# Patient Record
Sex: Male | Born: 1995 | Hispanic: Yes | Marital: Single | Smoking: Never smoker
Health system: Southern US, Community
[De-identification: ages and names within clinical notes are randomized; demographics above are authoritative.]

---
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
## 2015-05-23 ENCOUNTER — Encounter (HOSPITAL_COMMUNITY): Payer: Self-pay | Admitting: Emergency Medicine

## 2015-05-23 ENCOUNTER — Emergency Department (HOSPITAL_COMMUNITY)
Admission: EM | Admit: 2015-05-23 | Discharge: 2015-05-23 | Disposition: A | Discharge status: Home / Self Care | Payer: Self-pay | Attending: Emergency Medicine | Admitting: Emergency Medicine

## 2015-05-23 ENCOUNTER — Emergency Department (HOSPITAL_COMMUNITY): Payer: Self-pay

## 2015-05-23 DIAGNOSIS — S60222A Contusion of left hand, initial encounter: Secondary | ICD-10-CM | POA: Insufficient documentation

## 2015-05-23 DIAGNOSIS — S3992XA Unspecified injury of lower back, initial encounter: Secondary | ICD-10-CM | POA: Insufficient documentation

## 2015-05-23 DIAGNOSIS — S30811A Abrasion of abdominal wall, initial encounter: Secondary | ICD-10-CM | POA: Insufficient documentation

## 2015-05-23 DIAGNOSIS — Y92410 Unspecified street and highway as the place of occurrence of the external cause: Secondary | ICD-10-CM | POA: Insufficient documentation

## 2015-05-23 DIAGNOSIS — S81811A Laceration without foreign body, right lower leg, initial encounter: Secondary | ICD-10-CM | POA: Insufficient documentation

## 2015-05-23 DIAGNOSIS — Y9389 Activity, other specified: Secondary | ICD-10-CM | POA: Insufficient documentation

## 2015-05-23 DIAGNOSIS — S299XXA Unspecified injury of thorax, initial encounter: Secondary | ICD-10-CM | POA: Insufficient documentation

## 2015-05-23 DIAGNOSIS — T148XXA Other injury of unspecified body region, initial encounter: Secondary | ICD-10-CM

## 2015-05-23 DIAGNOSIS — Y999 Unspecified external cause status: Secondary | ICD-10-CM | POA: Insufficient documentation

## 2015-05-23 MED ORDER — NAPROXEN 500 MG PO TABS: 500.0000 mg | ORAL_TABLET | Freq: Two times a day (BID) | ORAL | Status: DC

## 2015-05-23 MED ORDER — METHOCARBAMOL 500 MG PO TABS: 1000.0000 mg | ORAL_TABLET | Freq: Four times a day (QID) | ORAL | Status: DC

## 2015-05-23 MED ORDER — KETOROLAC TROMETHAMINE 30 MG/ML IJ SOLN
30.0000 mg | Freq: Once | INTRAMUSCULAR | Status: AC
  Administered 2015-05-23: 30 mg via INTRAVENOUS
  Filled 2015-05-23: qty 1
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
## 2015-11-16 ENCOUNTER — Encounter: Payer: Self-pay | Admitting: Emergency Medicine

## 2015-11-16 ENCOUNTER — Emergency Department: Payer: Commercial Managed Care - PPO

## 2015-11-16 ENCOUNTER — Emergency Department
Admission: EM | Admit: 2015-11-16 | Discharge: 2015-11-16 | Disposition: A | Discharge status: Home / Self Care | Payer: Commercial Managed Care - PPO | Attending: Emergency Medicine | Admitting: Emergency Medicine

## 2015-11-16 DIAGNOSIS — X58XXXA Exposure to other specified factors, initial encounter: Secondary | ICD-10-CM | POA: Diagnosis not present

## 2015-11-16 DIAGNOSIS — S60552A Superficial foreign body of left hand, initial encounter: Secondary | ICD-10-CM

## 2015-11-16 DIAGNOSIS — Y9389 Activity, other specified: Secondary | ICD-10-CM | POA: Diagnosis not present

## 2015-11-16 DIAGNOSIS — Z79899 Other long term (current) drug therapy: Secondary | ICD-10-CM | POA: Insufficient documentation

## 2015-11-16 DIAGNOSIS — Y9289 Other specified places as the place of occurrence of the external cause: Secondary | ICD-10-CM | POA: Diagnosis not present

## 2015-11-16 DIAGNOSIS — Z791 Long term (current) use of non-steroidal anti-inflammatories (NSAID): Secondary | ICD-10-CM | POA: Diagnosis not present

## 2015-11-16 DIAGNOSIS — Y998 Other external cause status: Secondary | ICD-10-CM | POA: Diagnosis not present

## 2015-11-16 DIAGNOSIS — R52 Pain, unspecified: Secondary | ICD-10-CM

## 2015-11-16 NOTE — ED Notes (Signed)
Pt unable to sign e-signature due to an open chart by another ED staff
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
## 2015-11-16 NOTE — Discharge Instructions (Signed)
Please continue to keep wound clean and dry. Apply Neosporin daily. Follow-up with Dr. Hyacinth MeekerMiller in 5-7 days if any pain or discomfort. Return to the ER for any increased pain swelling warmth redness or drainage.

## 2016-08-20 ENCOUNTER — Emergency Department
Admission: EM | Admit: 2016-08-20 | Discharge: 2016-08-20 | Disposition: A | Discharge status: Home / Self Care | Payer: Commercial Managed Care - PPO | Attending: Student | Admitting: Student

## 2016-08-20 ENCOUNTER — Encounter: Payer: Self-pay | Admitting: Emergency Medicine

## 2016-08-20 ENCOUNTER — Emergency Department: Payer: Commercial Managed Care - PPO

## 2016-08-20 DIAGNOSIS — R0789 Other chest pain: Secondary | ICD-10-CM | POA: Diagnosis not present

## 2016-08-20 DIAGNOSIS — R079 Chest pain, unspecified: Secondary | ICD-10-CM

## 2016-08-20 LAB — CBC WITH DIFFERENTIAL/PLATELET
BASOS ABS: 0 10*3/uL (ref 0–0.1)
BASOS PCT: 1 %
EOS ABS: 0.2 10*3/uL (ref 0–0.7)
EOS PCT: 3 %
HCT: 46.1 % (ref 40.0–52.0)
Hemoglobin: 16.3 g/dL (ref 13.0–18.0)
LYMPHS PCT: 42 %
Lymphs Abs: 3.1 10*3/uL (ref 1.0–3.6)
MCH: 30.4 pg (ref 26.0–34.0)
MCHC: 35.3 g/dL (ref 32.0–36.0)
MCV: 86.3 fL (ref 80.0–100.0)
MONO ABS: 0.7 10*3/uL (ref 0.2–1.0)
Monocytes Relative: 10 %
Neutro Abs: 3.3 10*3/uL (ref 1.4–6.5)
Neutrophils Relative %: 44 %
PLATELETS: 185 10*3/uL (ref 150–440)
RBC: 5.34 MIL/uL (ref 4.40–5.90)
RDW: 12.6 % (ref 11.5–14.5)
WBC: 7.3 10*3/uL (ref 3.8–10.6)

## 2016-08-20 LAB — BASIC METABOLIC PANEL
ANION GAP: 7 (ref 5–15)
BUN: 12 mg/dL (ref 6–20)
CALCIUM: 9 mg/dL (ref 8.9–10.3)
CO2: 26 mmol/L (ref 22–32)
Chloride: 104 mmol/L (ref 101–111)
Creatinine, Ser: 0.94 mg/dL (ref 0.61–1.24)
Glucose, Bld: 98 mg/dL (ref 65–99)
POTASSIUM: 3.5 mmol/L (ref 3.5–5.1)
SODIUM: 137 mmol/L (ref 135–145)

## 2016-08-20 LAB — TROPONIN I: Troponin I: <0.03 ng/mL (ref <0.03)

## 2016-08-20 MED ORDER — IBUPROFEN 400 MG PO TABS
600.0000 mg | ORAL_TABLET | Freq: Once | ORAL | Status: AC
  Administered 2016-08-20: 600 mg via ORAL
  Filled 2016-08-20: qty 2

## 2016-08-20 NOTE — ED Triage Notes (Signed)
Pt presents to ED with reports of left sided chest pain with radiation to neck. Pt in NAD in triage.
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
## 2016-10-13 ENCOUNTER — Encounter: Payer: Self-pay | Admitting: Emergency Medicine

## 2016-10-13 ENCOUNTER — Emergency Department
Admission: EM | Admit: 2016-10-13 | Discharge: 2016-10-13 | Disposition: A | Discharge status: Home / Self Care | Payer: Commercial Managed Care - PPO | Attending: Emergency Medicine | Admitting: Emergency Medicine

## 2016-10-13 DIAGNOSIS — R059 Cough, unspecified: Secondary | ICD-10-CM

## 2016-10-13 DIAGNOSIS — Z79899 Other long term (current) drug therapy: Secondary | ICD-10-CM | POA: Diagnosis not present

## 2016-10-13 DIAGNOSIS — R0781 Pleurodynia: Secondary | ICD-10-CM | POA: Diagnosis not present

## 2016-10-13 DIAGNOSIS — R05 Cough: Secondary | ICD-10-CM | POA: Diagnosis not present

## 2016-10-13 MED ORDER — IBUPROFEN 600 MG PO TABS: 600.0000 mg | ORAL_TABLET | Freq: Three times a day (TID) | ORAL | 0 refills | Status: DC | PRN

## 2016-10-13 MED ORDER — BENZONATATE 100 MG PO CAPS: 200.0000 mg | ORAL_CAPSULE | Freq: Three times a day (TID) | ORAL | 0 refills | Status: DC | PRN

## 2016-10-13 NOTE — ED Triage Notes (Signed)
Pt reports left side pain that began today. Pt reports has had cough and cold symptoms for approximately one week. Pt denies fever, denies any changes in appetite. Pt ambulatory to triage. No apparent distress noted.

## 2016-10-13 NOTE — ED Notes (Signed)
Patient presents to the ED with severe rib pain and shortness of breath that occurred around 7am.  Patient states pain lasted about 1 min.  Now patient only reports tenderness to the area.  Patient is in no obvious distress at this time.  Denies difficulty breathing at this time.

## 2016-10-13 NOTE — ED Provider Notes (Signed)
[facility] Emergency Department Provider Note  ____________________________________________   First MD Initiated Contact with Patient 10/13/16 [phone]     (approximate)  I have reviewed the triage vital signs and the nursing notes.   HISTORY  Chief Complaint Left side pain and Cough   HPI Fernando Wolfe is a 20 y.o. male is here with complaint of left lateral side pain after coughing. Patient states he has not taken any over-the-counter medication for cough. He states that he has had some cold symptoms and occasionally will cough. His cough has been nonproductive. He denies any fever, chills, nausea or vomiting. He is also not taking any over-the-counter medication for his cold symptoms. He denies smoking. He has no history of respiratory problems. Pain is increased with movement. Patient states "I think I pulled a muscle". He denies any breathing difficulty. He is taken some Tylenol infrequently without any improvement. He rates his pain as 7/10.   History reviewed. No pertinent past medical history.  There are no active problems to display for this patient.   No past surgical history on file.  Prior to Admission medications   Medication Sig Start Date End Date Taking? Authorizing Provider  benzonatate (TESSALON PERLES) 100 MG capsule Take 2 capsules (200 mg total) by mouth 3 (three) times daily as needed for cough. 10/13/16 10/13/17  Tommi Rumpshonda L Abiageal Blowe, PA-C  ibuprofen (ADVIL,MOTRIN) 600 MG tablet Take 1 tablet (600 mg total) by mouth every 8 (eight) hours as needed. 10/13/16   Tommi Rumpshonda L Bahja Bence, PA-C  methocarbamol (ROBAXIN) 500 MG tablet Take 2 tablets (1,000 mg total) by mouth 4 (four) times daily. 05/23/15   Renne CriglerJoshua Geiple, PA-C  naproxen (NAPROSYN) 500 MG tablet Take 1 tablet (500 mg total) by mouth 2 (two) times daily. 05/23/15   Renne CriglerJoshua Geiple, PA-C    Allergies Patient has no known allergies.  No family history on file.  Social History Social  History  Substance Use Topics  . Smoking status: Never Smoker  . Smokeless tobacco: Never Used  . Alcohol use Yes     Comment: "not much"    Review of Systems Constitutional: No fever/chills Eyes: No visual changes. Cardiovascular: Denies chest pain. Respiratory: Denies shortness of breath. Positive nonproductive cough. Gastrointestinal:  No nausea, no vomiting. Musculoskeletal: Negative for back pain. Positive left rib pain. Skin: Negative for rash. Neurological: Negative for headaches, focal weakness or numbness.  10-point ROS otherwise negative.  ____________________________________________   PHYSICAL EXAM:  VITAL SIGNS: ED Triage Vitals [10/13/16 0855]  Enc Vitals Group     BP 117/79     Pulse Rate 73     Resp 18     Temp 98.2 F (36.8 C)     Temp Source Oral     SpO2 100 %     Weight 205 lb (93 kg)     Height 5\' 11"  (1.803 m)     Head Circumference      Peak Flow      Pain Score 7     Pain Loc      Pain Edu?      Excl. in GC?     Constitutional: Alert and oriented. Well appearing and in no acute distress. Eyes: Conjunctivae are normal. PERRL. EOMI. Head: Atraumatic. Nose: Minimal congestion/no rhinnorhea.   EACs and TMs are clear bilaterally. Mouth/Throat: Mucous membranes are moist.  Oropharynx non-erythematous. Neck: No stridor.   Hematological/Lymphatic/Immunilogical: No cervical lymphadenopathy. Cardiovascular: Normal rate, regular rhythm. Grossly normal heart sounds.  Good  peripheral circulation. Respiratory: Normal respiratory effort.  No retractions. Lungs CTAB. Gastrointestinal: Soft and nontender. No distention.  Musculoskeletal: Moves upper and lower extremities without any difficulty. Normal gait was noted. There is some minimal tenderness on palpation of the left lateral ribs approximately 7,8 and 9. No deformity was noted. No soft tissue swelling in this area. No ecchymosis or abrasions seen. Neurologic:  Normal speech and language. No gross  focal neurologic deficits are appreciated. No gait instability. Skin:  Skin is warm, dry and intact. No rash noted. Psychiatric: Mood and affect are normal. Speech and behavior are normal.  ____________________________________________   LABS (all labs ordered are listed, but only abnormal results are displayed)  Labs Reviewed - No data to display  PROCEDURES  Procedure(s) performed: None  Procedures  Critical Care performed: No  ____________________________________________   INITIAL IMPRESSION / ASSESSMENT AND PLAN / ED COURSE  Pertinent labs & imaging results that were available during my care of the patient were reviewed by me and considered in my medical decision making (see chart for details).    Clinical Course    Patient agrees with assessment that most likely his pulled some muscles while coughing. Patient [facility] as needed for cough every 8 hours and ibuprofen to be taken every 8 hours with food. He will follow up with his primary care Dr. Phineas Realharles Drew if any continued problems.  ____________________________________________   FINAL CLINICAL IMPRESSION(S) / ED DIAGNOSES  Final diagnoses:  Rib pain on left side  Cough      NEW MEDICATIONS STARTED DURING THIS VISIT:  Discharge Medication List as of 10/13/2016  9:51 AM    START taking these medications   Details  benzonatate (TESSALON PERLES) 100 MG capsule Take 2 capsules (200 mg total) by mouth 3 (three) times daily as needed for cough., Starting Fri 10/13/2016, Until Sat 10/13/2017, Print    ibuprofen (ADVIL,MOTRIN) 600 MG tablet Take 1 tablet (600 mg total) by mouth every 8 (eight) hours as needed., Starting Fri 10/13/2016, Print         Note:  This document was prepared using Dragon voice recognition software and may include unintentional dictation errors.    Tommi Rumpshonda L Kenzi Bardwell, PA-C 10/13/16 1000    Nita Sicklearolina Veronese, MD 10/15/16 [phone]

## 2016-10-13 NOTE — Discharge Instructions (Signed)
Been taking Tessalon every 8 hours as needed for cough. This medication will not cause drowsiness. Ibuprofen every 8 hours with food.

## 2017-04-25 ENCOUNTER — Ambulatory Visit
Admission: EM | Admit: 2017-04-25 | Discharge: 2017-04-25 | Disposition: A | Discharge status: Home / Self Care | Payer: Commercial Managed Care - PPO | Attending: Family Medicine | Admitting: Family Medicine

## 2017-04-25 ENCOUNTER — Encounter: Payer: Self-pay | Admitting: *Deleted

## 2017-04-25 DIAGNOSIS — Z113 Encounter for screening for infections with a predominantly sexual mode of transmission: Secondary | ICD-10-CM

## 2017-04-25 DIAGNOSIS — N4889 Other specified disorders of penis: Secondary | ICD-10-CM

## 2017-04-25 DIAGNOSIS — R300 Dysuria: Secondary | ICD-10-CM

## 2017-04-25 LAB — URINALYSIS, COMPLETE (UACMP) WITH MICROSCOPIC
Bacteria, UA: NONE SEEN
Bilirubin Urine: NEGATIVE
GLUCOSE, UA: NEGATIVE mg/dL
HGB URINE DIPSTICK: NEGATIVE
Ketones, ur: NEGATIVE mg/dL
Leukocytes, UA: NEGATIVE
NITRITE: NEGATIVE
PROTEIN: NEGATIVE mg/dL
RBC / HPF: NONE SEEN RBC/hpf (ref 0–5)
SQUAMOUS EPITHELIAL / LPF: NONE SEEN
Specific Gravity, Urine: 1.015 (ref 1.005–1.030)
pH: 6 (ref 5.0–8.0)

## 2017-04-25 LAB — CHLAMYDIA/NGC RT PCR (ARMC ONLY)
Chlamydia Tr: NOT DETECTED
N gonorrhoeae: NOT DETECTED

## 2017-04-25 MED ORDER — AZITHROMYCIN 500 MG PO TABS
1000.0000 mg | ORAL_TABLET | Freq: Every day | ORAL | Status: DC
  Administered 2017-04-25: 1000 mg via ORAL

## 2017-04-25 MED ORDER — CEFTRIAXONE SODIUM 250 MG IJ SOLR
250.0000 mg | Freq: Once | INTRAMUSCULAR | Status: AC
  Administered 2017-04-25: 250 mg via INTRAMUSCULAR

## 2017-04-25 NOTE — ED Provider Notes (Signed)
[facility] ____________________________________________  Time seen: Approximately 5:28 PM  I have reviewed the triage vital signs and the nursing notes.   HISTORY  Chief Complaint Penis Pain   HPI Fernando Wolfe is a 21 y.o. male presenting for evaluation of burning with urination that has been present for about 2 weeks. Patient states approximate one week prior to the onset of dysuria he had unprotected sex with a male partner described as a one night stand that he did not know. Reports no condoms used. Reports prior to that sexual encounter, last sexual encounter approximately 3 months prior without any symptoms afterwards. Patient reports he has some burning with urination only. Denies irritation or burning at rest. Denies any urinary frequency, or urinary urgency or abdominal pain or back pain. Denies fevers. Denies any penile discharge or drainage. Reports that he does have a bump on the foreskin of his penis that is in present and unchanged for at least 6 months to one year. Has never had that evaluated. Denies any history of cold sores or herpes. Denies previous STDs. Patient reports otherwise he feels well. Denies any aggravating or alleviating factors. No medications taken at home. Denies history of similar. Denies previous urinary tract infection. States not diabetic. No history of HIV. Denies other complaints.  Denies chest pain, shortness of breath, abdominal pain, extremity pain, extremity swelling or rash. Denies recent sickness. Denies recent antibiotic use.    History reviewed. No pertinent past medical history. Denies There are no active problems to display for this patient.   History reviewed. No pertinent surgical history.   No current facility-administered medications for this encounter.   Current Outpatient Prescriptions:  None  Allergies Patient has no known allergies.  History reviewed. No pertinent family history.  Social  History Social History  Substance Use Topics  . Smoking status: Never Smoker  . Smokeless tobacco: Never Used  . Alcohol use Yes     Comment: "not much"    Review of Systems Constitutional: No fever/chills Eyes: No visual changes. ENT: No sore throat. Cardiovascular: Denies chest pain. Respiratory: Denies shortness of breath. Gastrointestinal: No abdominal pain.  No nausea, no vomiting.  No diarrhea.  No constipation. Genitourinary: Positive for dysuria. Musculoskeletal: Negative for back pain. Skin: Negative for rash.   ____________________________________________   PHYSICAL EXAM:  VITAL SIGNS: ED Triage Vitals  Enc Vitals Group     BP 04/25/17 1709 (!) 145/86     Pulse Rate 04/25/17 1709 94     Resp 04/25/17 1709 16     Temp 04/25/17 1709 98.5 F (36.9 C)     Temp Source 04/25/17 1709 Oral     SpO2 04/25/17 1709 100 %     Weight 04/25/17 1710 210 lb (95.3 kg)     Height 04/25/17 1710 5\' 11"  (1.803 m)     Head Circumference --      Peak Flow --      Pain Score 04/25/17 1710 0     Pain Loc --      Pain Edu? --      Excl. in GC? --     Constitutional: Alert and oriented. Well appearing and in no acute distress. ENT      Mouth/Throat: Mucous membranes are moist.Oropharynx non-erythematous. Cardiovascular: Normal rate, regular rhythm. Grossly normal heart sounds.  Good peripheral circulation. Respiratory: Normal respiratory effort without tachypnea nor retractions. Breath sounds are clear and equal bilaterally. No wheezes, rales, rhonchi. Gastrointestinal: Soft and nontender. No CVA tenderness. Male:  Exam completed with Jeannett Senior RN at bedside as chaperone. No mass or bulge. No penile or testicular tenderness. No discharge. No erythema. Uncircumcised with 1 small <0.5cm circular skin colored dome lesion present to the dorsal aspect of penis without erythema, tenderness or drainage. No other lesion noted. No vesicular or ulcerative appearing lesion. Musculoskeletal:   No midline cervical, thoracic or lumbar tenderness to palpation.  Neurologic:  Normal speech and language. Speech is normal. No gait instability.  Skin:  Skin is warm, dry. Psychiatric: Mood and affect are normal. Speech and behavior are normal. Patient exhibits appropriate insight and judgment   ___________________________________________   LABS (all labs ordered are listed, but only abnormal results are displayed)  Labs Reviewed  URINALYSIS, COMPLETE (UACMP) WITH MICROSCOPIC - Abnormal; Notable for the following:       Result Value   Color, Urine STRAW (*)    All other components within normal limits  CHLAMYDIA/NGC RT PCR ([facility] ONLY)  HIV ANTIBODY (ROUTINE TESTING)  RPR  HSV(HERPES SIMPLEX VRS) I + II AB-IGG  HSV(HERPES SIMPLEX VRS) I + II AB-IGM    PROCEDURES Procedures     INITIAL IMPRESSION / ASSESSMENT AND PLAN / ED COURSE  Pertinent labs & imaging results that were available during my care of the patient were reviewed by me and considered in my medical decision making (see chart for details).  Very well-appearing patient. No acute distress. Urinalysis reviewed. Discussed in detail with patient regarding testing of STDs. Will test for gonorrhea, chlamydia, syphilis, herpes, HIV. Since the patient will empirically treat for gonorrhea and chlamydia while in urgent care with 1 g oral azithromycin and 250 mg IM Rocephin. Discussed with patient to follow-up with primary care or [facility] regarding penile lesion and concern for HPV/genital wart. Discussed in detail with patient no sexual activity until all symptom resolution and follow-up. Discussed practicing safe sex. Encouraged supportive care.Discussed indication, risks and benefits of medications with patient.  Discussed follow up with Primary care physician this week. Discussed follow up and return parameters including no resolution or any worsening concerns. Patient verbalized understanding and  agreed to plan.   ____________________________________________   FINAL CLINICAL IMPRESSION(S) / ED DIAGNOSES  Final diagnoses:  Dysuria  Screen for STD (sexually transmitted disease)     Discharge Medication List as of 04/25/2017  5:48 PM      Note: This dictation was prepared with Dragon dictation along with smaller phrase technology. Any transcriptional errors that result from this process are unintentional.         Renford Dills, NP 04/25/17 1812

## 2017-04-25 NOTE — Discharge Instructions (Signed)
Practice safe sex. No sexual activity until symptoms resolve and follow up as discussed.   Follow up with your primary care physician this week or the above as discussed. Return to Urgent care for new or worsening concerns.

## 2017-04-25 NOTE — ED Triage Notes (Signed)
Patient started having symptom of burning on urination 2 weeks ago. Patient is not sure if he has a STD or UTI.

## 2017-04-27 ENCOUNTER — Encounter: Payer: Self-pay | Admitting: Emergency Medicine

## 2017-04-27 ENCOUNTER — Emergency Department
Admission: EM | Admit: 2017-04-27 | Discharge: 2017-04-27 | Disposition: A | Discharge status: Home / Self Care | Payer: Self-pay | Attending: Emergency Medicine | Admitting: Emergency Medicine

## 2017-04-27 ENCOUNTER — Emergency Department: Payer: Self-pay

## 2017-04-27 DIAGNOSIS — N390 Urinary tract infection, site not specified: Secondary | ICD-10-CM | POA: Insufficient documentation

## 2017-04-27 DIAGNOSIS — Z79899 Other long term (current) drug therapy: Secondary | ICD-10-CM | POA: Insufficient documentation

## 2017-04-27 DIAGNOSIS — R109 Unspecified abdominal pain: Secondary | ICD-10-CM

## 2017-04-27 LAB — URINALYSIS, COMPLETE (UACMP) WITH MICROSCOPIC
Bacteria, UA: NONE SEEN
Bilirubin Urine: NEGATIVE
GLUCOSE, UA: NEGATIVE mg/dL
HGB URINE DIPSTICK: NEGATIVE
Ketones, ur: NEGATIVE mg/dL
Nitrite: NEGATIVE
PH: 6 (ref 5.0–8.0)
Protein, ur: NEGATIVE mg/dL
SPECIFIC GRAVITY, URINE: 1.019 (ref 1.005–1.030)
SQUAMOUS EPITHELIAL / LPF: NONE SEEN

## 2017-04-27 LAB — HIV ANTIBODY (ROUTINE TESTING W REFLEX): HIV Screen 4th Generation wRfx: NONREACTIVE

## 2017-04-27 LAB — CBC
HCT: 49.7 % (ref 40.0–52.0)
Hemoglobin: 17.4 g/dL (ref 13.0–18.0)
MCH: 29.8 pg (ref 26.0–34.0)
MCHC: 35 g/dL (ref 32.0–36.0)
MCV: 85.2 fL (ref 80.0–100.0)
PLATELETS: 232 10*3/uL (ref 150–440)
RBC: 5.84 MIL/uL (ref 4.40–5.90)
RDW: 12.9 % (ref 11.5–14.5)
WBC: 6.6 10*3/uL (ref 3.8–10.6)

## 2017-04-27 LAB — COMPREHENSIVE METABOLIC PANEL
ALK PHOS: 112 U/L (ref 38–126)
ALT: 35 U/L (ref 17–63)
AST: 34 U/L (ref 15–41)
Albumin: 5 g/dL (ref 3.5–5.0)
Anion gap: 10 (ref 5–15)
BUN: 12 mg/dL (ref 6–20)
CALCIUM: 10.3 mg/dL (ref 8.9–10.3)
CO2: 25 mmol/L (ref 22–32)
CREATININE: 0.97 mg/dL (ref 0.61–1.24)
Chloride: 104 mmol/L (ref 101–111)
GFR calc non Af Amer: >60 mL/min (ref >60)
GLUCOSE: 99 mg/dL (ref 65–99)
Potassium: 3.7 mmol/L (ref 3.5–5.1)
Sodium: 139 mmol/L (ref 135–145)
Total Bilirubin: 0.9 mg/dL (ref 0.3–1.2)
Total Protein: 8.8 g/dL — ABNORMAL HIGH (ref 6.5–8.1)

## 2017-04-27 LAB — LIPASE, BLOOD: Lipase: 28 U/L (ref 11–51)

## 2017-04-27 LAB — HSV(HERPES SIMPLEX VRS) I + II AB-IGG
HSV 1 GLYCOPROTEIN G AB, IGG: 5.99 {index} — AB (ref 0.00–0.90)
HSV 2 Glycoprotein G Ab, IgG: <0.91 index (ref 0.00–0.90)

## 2017-04-27 LAB — HSV(HERPES SIMPLEX VRS) I + II AB-IGM: HSVI/II Comb IgM: <0.91 Ratio (ref 0.00–0.90)

## 2017-04-27 LAB — RPR: RPR Ser Ql: NONREACTIVE

## 2017-04-27 MED ORDER — CEPHALEXIN 500 MG PO CAPS: 500.0000 mg | ORAL_CAPSULE | Freq: Two times a day (BID) | ORAL | 0 refills | Status: DC

## 2017-04-27 MED ORDER — CEPHALEXIN 500 MG PO CAPS
500.0000 mg | ORAL_CAPSULE | Freq: Once | ORAL | Status: AC
  Administered 2017-04-27: 500 mg via ORAL
  Filled 2017-04-27: qty 1

## 2017-04-27 NOTE — ED Notes (Signed)
MD at bedside with pt at this time

## 2017-04-27 NOTE — ED Triage Notes (Signed)
Pt reports left side flank pain that radiates to LLQ for three weeks. Pt reports nausea and diarrhea as well.

## 2017-04-27 NOTE — ED Provider Notes (Signed)
[facility] Emergency Department Provider Note   ____________________________________________    I have reviewed the triage vital signs and the nursing notes.   HISTORY  Chief Complaint Flank Pain and Abdominal Pain     HPI Fernando Wolfe is a 21 y.o. male who presents with complaints of dysuria and mild left-sided pain. Patient reports this is been going on for several days. He was seen in urgent care and treated with Rocephin and azithromycin for possible STD however his labs have returned negative for chlamydia or GC. Patient reports he is not circumcised. Denies a history of urinary tract infection but reports frequent urination and burning with urination. No hematuria. No history of kidney stones. No nausea or vomiting.   History reviewed. No pertinent past medical history.  There are no active problems to display for this patient.   History reviewed. No pertinent surgical history.  Prior to Admission medications   Medication Sig Start Date End Date Taking? Authorizing Provider  benzonatate (TESSALON PERLES) 100 MG capsule Take 2 capsules (200 mg total) by mouth 3 (three) times daily as needed for cough. 10/13/16 10/13/17  Tommi RumpsSummers, Rhonda L, PA-C  cephALEXin (KEFLEX) 500 MG capsule Take 1 capsule (500 mg total) by mouth 2 (two) times daily. 04/27/17   Jene EveryKinner, Hydeia Mcatee, MD  ibuprofen (ADVIL,MOTRIN) 600 MG tablet Take 1 tablet (600 mg total) by mouth every 8 (eight) hours as needed. 10/13/16   Tommi RumpsSummers, Rhonda L, PA-C  methocarbamol (ROBAXIN) 500 MG tablet Take 2 tablets (1,000 mg total) by mouth 4 (four) times daily. 05/23/15   Renne CriglerGeiple, Joshua, PA-C  naproxen (NAPROSYN) 500 MG tablet Take 1 tablet (500 mg total) by mouth 2 (two) times daily. 05/23/15   Renne CriglerGeiple, Joshua, PA-C     Allergies Patient has no known allergies.  No family history on file.  Social History Social History  Substance Use Topics  . Smoking status: Never Smoker  . Smokeless  tobacco: Never Used  . Alcohol use Yes     Comment: "not much"    Review of Systems  Constitutional: No fever/chills Eyes: No visual changes.  ENT: No sore throat. Cardiovascular: Denies chest pain. Respiratory: Denies shortness of breath. Gastrointestinal: No abdominal pain.  No nausea, no vomiting.   Genitourinary: As above Musculoskeletal: Negative for back pain. Skin: Negative for rash. Neurological: Negative for headaches or weakness   ____________________________________________   PHYSICAL EXAM:  VITAL SIGNS: ED Triage Vitals [04/27/17 1703]  Enc Vitals Group     BP (!) 138/107     Pulse Rate 93     Resp 18     Temp 99.1 F (37.3 C)     Temp Source Oral     SpO2 99 %     Weight 95.3 kg (210 lb)     Height 1.803 m (5\' 11" )     Head Circumference      Peak Flow      Pain Score 7     Pain Loc      Pain Edu?      Excl. in GC?     Constitutional: Alert and oriented. No acute distress. Pleasant and interactive Eyes: Conjunctivae are normal.   Nose: No congestion/rhinnorhea. Mouth/Throat: Mucous membranes are moist.    Cardiovascular: Normal rate, regular rhythm. Grossly normal heart sounds.  Good peripheral circulation. Respiratory: Normal respiratory effort.  No retractions. Lungs CTAB. Gastrointestinal: Soft and nontender. No distention.  No CVA tenderness. Genitourinary: deferred Musculoskeletal: No lower extremity tenderness nor  edema.  Warm and well perfused Neurologic:  Normal speech and language. No gross focal neurologic deficits are appreciated.  Skin:  Skin is warm, dry and intact. No rash noted. Psychiatric: Mood and affect are normal. Speech and behavior are normal.  ____________________________________________   LABS (all labs ordered are listed, but only abnormal results are displayed)  Labs Reviewed  COMPREHENSIVE METABOLIC PANEL - Abnormal; Notable for the following:       Result Value   Total Protein 8.8 (*)    All other components  within normal limits  URINALYSIS, COMPLETE (UACMP) WITH MICROSCOPIC - Abnormal; Notable for the following:    Color, Urine YELLOW (*)    APPearance CLEAR (*)    Leukocytes, UA LARGE (*)    All other components within normal limits  URINE CULTURE  LIPASE, BLOOD  CBC   ____________________________________________  EKG  None ____________________________________________  RADIOLOGY  CT renal stone study negative ____________________________________________   PROCEDURES  Procedure(s) performed: No    Critical Care performed:No ____________________________________________   INITIAL IMPRESSION / ASSESSMENT AND PLAN / ED COURSE  Pertinent labs & imaging results that were available during my care of the patient were reviewed by me and considered in my medical decision making (see chart for details).  Patient well-appearing and in no acute distress. Exam is reassuring. Positive leukocytes on urinalysis, I suspect UTI given his history of present illness. Urine culture sent, I will start him on Keflex. Return precautions discussed    ____________________________________________   FINAL CLINICAL IMPRESSION(S) / ED DIAGNOSES  Final diagnoses:  Acute left flank pain  Lower urinary tract infectious disease      NEW MEDICATIONS STARTED DURING THIS VISIT:  Discharge Medication List as of 04/27/2017  8:04 PM    START taking these medications   Details  cephALEXin (KEFLEX) 500 MG capsule Take 1 capsule (500 mg total) by mouth 2 (two) times daily., Starting Fri 04/27/2017, Print         Note:  This document was prepared using Dragon voice recognition software and may include unintentional dictation errors.    Jene Every, MD 04/27/17 2026

## 2017-04-29 LAB — URINE CULTURE: Culture: NO GROWTH

## 2017-05-04 ENCOUNTER — Telehealth: Payer: Self-pay | Admitting: Emergency Medicine

## 2017-05-04 NOTE — Telephone Encounter (Signed)
Pat had left message saying he lost his antibiotic pills.  I called him and explained result of urine culture is no growth.  I asked if he still would want the antibiotics.  He said no and that he will call his pcp-charles drew and mak appt regarding his symptoms.

## 2017-06-11 ENCOUNTER — Encounter: Payer: Self-pay | Admitting: Emergency Medicine

## 2017-06-11 ENCOUNTER — Emergency Department
Admission: EM | Admit: 2017-06-11 | Discharge: 2017-06-11 | Disposition: A | Discharge status: Home / Self Care | Payer: Self-pay | Attending: Emergency Medicine | Admitting: Emergency Medicine

## 2017-06-11 DIAGNOSIS — F419 Anxiety disorder, unspecified: Secondary | ICD-10-CM

## 2017-06-11 DIAGNOSIS — R0789 Other chest pain: Secondary | ICD-10-CM

## 2017-06-11 DIAGNOSIS — Z79899 Other long term (current) drug therapy: Secondary | ICD-10-CM | POA: Insufficient documentation

## 2017-06-11 NOTE — Discharge Instructions (Signed)
[facility]up [facility] see contact information attached. If symptoms worsen or do not hesitate to return to emergency department.

## 2017-06-11 NOTE — ED Provider Notes (Signed)
[facility] Emergency Department Provider Note   ____________________________________________   I have reviewed the triage vital signs and the nursing notes.   HISTORY  Chief Complaint Chest Pain    HPI Fernando Wolfe is a 21 y.o. male presents emergency department with left-sided chest pain and anxiety after having an argument with his girlfriend earlier today. Patient reports symptoms have improved since arriving in the emergency department and he feels symptoms are related to situational anxiety. Patient is currently being treated for H. pylori and has been compliant with antibiotic regimen. Patient denies fever, chills, headache, vision changes, shortness of breath, abdominal pain, nausea and vomiting.  History reviewed. No pertinent past medical history.  There are no active problems to display for this patient.   History reviewed. No pertinent surgical history.  Prior to Admission medications   Medication Sig Start Date End Date Taking? Authorizing Provider  benzonatate (TESSALON PERLES) 100 MG capsule Take 2 capsules (200 mg total) by mouth 3 (three) times daily as needed for cough. 10/13/16 10/13/17  Tommi RumpsSummers, Rhonda L, PA-C  cephALEXin (KEFLEX) 500 MG capsule Take 1 capsule (500 mg total) by mouth 2 (two) times daily. 04/27/17   Jene EveryKinner, Robert, MD  ibuprofen (ADVIL,MOTRIN) 600 MG tablet Take 1 tablet (600 mg total) by mouth every 8 (eight) hours as needed. 10/13/16   Tommi RumpsSummers, Rhonda L, PA-C  methocarbamol (ROBAXIN) 500 MG tablet Take 2 tablets (1,000 mg total) by mouth 4 (four) times daily. 05/23/15   Renne CriglerGeiple, Joshua, PA-C  naproxen (NAPROSYN) 500 MG tablet Take 1 tablet (500 mg total) by mouth 2 (two) times daily. 05/23/15   Renne CriglerGeiple, Joshua, PA-C    Allergies Patient has no known allergies.  No family history on file.  Social History Social History  Substance Use Topics  . Smoking status: Never Smoker  . Smokeless tobacco: Never Used  .  Alcohol use Yes     Comment: "not much"    Review of Systems Constitutional: Negative for fever/chills.  Eyes: No visual changes. ENT:  Negative for sore throat and for difficulty swallowing Cardiovascular: Denies chest pain. Respiratory: Denies cough. Denies shortness of breath. Gastrointestinal: No abdominal pain.  No nausea, vomiting, diarrhea. Genitourinary: Negative for dysuria. Musculoskeletal: Negative for back pain. Skin: Negative for rash. Neurological: Negative for headaches.  Negative focal weakness or numbness. Negative for loss of consciousness. Able to ambulate. ____________________________________________   PHYSICAL EXAM:  VITAL SIGNS: ED Triage Vitals  Enc Vitals Group     BP 06/11/17 1526 137/80     Pulse Rate 06/11/17 1526 89     Resp 06/11/17 1526 18     Temp 06/11/17 1526 98.8 F (37.1 C)     Temp Source 06/11/17 1526 Oral     SpO2 06/11/17 1526 98 %     Weight 06/11/17 1527 210 lb (95.3 kg)     Height 06/11/17 1527 5\' 11"  (1.803 m)     Head Circumference --      Peak Flow --      Pain Score 06/11/17 1536 4     Pain Loc --      Pain Edu? --      Excl. in GC? --     Constitutional: Alert and oriented. Well appearing and in no acute distress.  Head: Normocephalic and atraumatic. Eyes: Conjunctivae are normal. PERRL. Normal extraocular movements.. Ears: Canals clear. TMs intact bilaterally. Nose: No congestion/rhinorrhea/epistaxis. Mouth/Throat: Mucous membranes are moist. Neck: Supple.  Cardiovascular: Normal rate, regular rhythm. Normal  distal pulses. Respiratory: Normal respiratory effort. No wheezes/rales/rhonchi. Lungs CTAB Gastrointestinal: Soft and nontender. Musculoskeletal: Nontender with normal range of motion in all extremities. Neurologic: Normal speech and language.  Skin:  Skin is warm, dry and intact. No rash noted. Psychiatric: Mood and affect are normal.  ____________________________________________   LABS (all labs ordered  are listed, but only abnormal results are displayed)  Labs Reviewed - No data to display ____________________________________________  EKG none ____________________________________________  RADIOLOGY none ____________________________________________   PROCEDURES  Procedure(s) performed: no   Critical Care performed: no ____________________________________________   INITIAL IMPRESSION / ASSESSMENT AND PLAN / ED COURSE  Pertinent labs & imaging results that were available during my care of the patient were reviewed by me and considered in my medical decision making (see chart for details).  Patient presents to emergency department with left sided chest pain that has been intermittent for one year however worsened during an argument with his girlfriend earlier today. . History and physical Marland Kitchenexam findings are reassuring symptoms are likely associated with anxiety he is experiencing with stressful situations. Patient reported he was symptom-free at this time and felt all physical symptoms were associated with anxiety. Recommended patient follow-up with primary care provider to address anxiety. Vital signs were reassuring during course of care in emergency department. Patient is currently without insurance coverage, [facility]. Patient advised return to the emergency department if symptoms return or worsen.     ____________________________________________   FINAL CLINICAL IMPRESSION(S) / ED DIAGNOSES  Final diagnoses:  Anxiety  Atypical chest pain       NEW MEDICATIONS STARTED DURING THIS VISIT:  Discharge Medication List as of 06/11/2017  4:34 PM       Note:  This document was prepared using Dragon voice recognition software and may include unintentional dictation errors.    Clois ComberLittle, Traci M, PA-C 06/11/17 1944    Minna AntisPaduchowski, Kevin, MD 06/11/17 2322

## 2017-06-11 NOTE — ED Notes (Signed)
See triage note  States he has had intermittent left sided chest pain for about 1 year  Pain eases off with rest  And will occasional return with food  Denies any fever,or cough  But has had some nausea

## 2017-06-11 NOTE — ED Triage Notes (Signed)
L lateral chest wall pain x 1 year. Denies injury. States began today after argument with girlfriend.

## 2017-09-10 ENCOUNTER — Ambulatory Visit
Admission: EM | Admit: 2017-09-10 | Discharge: 2017-09-10 | Disposition: A | Discharge status: Home / Self Care | Payer: Self-pay | Attending: Family Medicine | Admitting: Family Medicine

## 2017-09-10 ENCOUNTER — Encounter: Payer: Self-pay | Admitting: Emergency Medicine

## 2017-09-10 DIAGNOSIS — J988 Other specified respiratory disorders: Secondary | ICD-10-CM

## 2017-09-10 DIAGNOSIS — R0981 Nasal congestion: Secondary | ICD-10-CM

## 2017-09-10 DIAGNOSIS — R05 Cough: Secondary | ICD-10-CM

## 2017-09-10 MED ORDER — DOXYCYCLINE HYCLATE 100 MG PO CAPS: 100.0000 mg | ORAL_CAPSULE | Freq: Two times a day (BID) | ORAL | 0 refills | Status: DC

## 2017-09-10 MED ORDER — HYDROCOD POLST-CPM POLST ER 10-8 MG/5ML PO SUER: 5.0000 mL | Freq: Two times a day (BID) | ORAL | 0 refills | Status: DC | PRN

## 2017-09-10 NOTE — Discharge Instructions (Signed)
Antibiotic as prescribed.  Take care  Dr. Jovon Winterhalter  

## 2017-09-10 NOTE — ED Provider Notes (Signed)
[facility]    CSN: [id number] Arrival date & time: 09/10/17  1752  History   Chief Complaint Chief Complaint  Patient presents with  . Nasal Congestion  . Cough    HPI  21 year old male presents with the above complaints.  Patient states that he been sick for the past 2 weeks.  He has had severe sinus pressure and congestion.  Associated dry cough.  Cough is severe.  No fever.  No shortness of breath.  No medications or interventions tried.  No known exacerbating or relieving factors.  No other associated symptoms.  No other complaints at this time.  History reviewed. No pertinent past medical history.  History reviewed. No pertinent surgical history.  Home Medications    Prior to Admission medications   Medication Sig Start Date End Date Taking? Authorizing Provider  chlorpheniramine-HYDROcodone (TUSSIONEX PENNKINETIC ER) 10-8 MG/5ML SUER Take 5 mLs by mouth every 12 (twelve) hours as needed. 09/10/17   Tommie Sams, DO  doxycycline (VIBRAMYCIN) 100 MG capsule Take 1 capsule (100 mg total) by mouth 2 (two) times daily. 09/10/17   Tommie Sams, DO  ibuprofen (ADVIL,MOTRIN) 600 MG tablet Take 1 tablet (600 mg total) by mouth every 8 (eight) hours as needed. 10/13/16   Tommi Rumps, PA-C   Family History History reviewed. No pertinent family history.  Social History Social History  Substance Use Topics  . Smoking status: Never Smoker  . Smokeless tobacco: Never Used  . Alcohol use Yes     Comment: "not much"   Allergies   Patient has no known allergies.   Review of Systems Review of Systems  Constitutional: Negative for fever.  HENT: Positive for congestion, sinus pain and sinus pressure.   Respiratory: Positive for cough.    Physical Exam Triage Vital Signs ED Triage Vitals  Enc Vitals Group     BP 09/10/17 1823 137/80     Pulse Rate 09/10/17 1823 90     Resp 09/10/17 1823 16     Temp 09/10/17 1823 98.9 F (37.2 C)     Temp Source  09/10/17 1823 Oral     SpO2 09/10/17 1823 100 %     Weight 09/10/17 1821 212 lb 11.9 oz (96.5 kg)     Height 09/10/17 1821 5\' 11"  (1.803 m)     Head Circumference --      Peak Flow --      Pain Score 09/10/17 1821 7     Pain Loc --      Pain Edu? --      Excl. in GC? --    Updated Vital Signs BP 137/80 (BP Location: Left Arm)   Pulse 90   Temp 98.9 F (37.2 C) (Oral)   Resp 16   Ht 5\' 11"  (1.803 m)   Wt 212 lb 11.9 oz (96.5 kg)   SpO2 100%   BMI 29.67 kg/m   Physical Exam  Constitutional: He is oriented to person, place, and time. He appears well-developed. No distress.  HENT:  Head: Normocephalic and atraumatic.  Mouth/Throat: Oropharynx is clear and moist.  Mild maxillary sinus tenderness.  Eyes: Conjunctivae are normal. No scleral icterus.  Neck: Neck supple.  Cardiovascular: Normal rate and regular rhythm.   No murmur heard. Pulmonary/Chest: Effort normal. No respiratory distress.  Crackes, left base.  Lymphadenopathy:    He has no cervical adenopathy.  Neurological: He is alert and oriented to person, place, and time.  Skin: Skin is warm. No  rash noted.  Psychiatric: He has a normal mood and affect.  Vitals reviewed.  UC Treatments / Results  Labs (all labs ordered are listed, but only abnormal results are displayed) Labs Reviewed - No data to display  EKG  EKG Interpretation None       Radiology No results found.  Procedures Procedures (including critical care time)  Medications Ordered in UC Medications - No data to display   Initial Impression / Assessment and Plan / UC Course  I have reviewed the triage vital signs and the nursing notes.  Pertinent labs & imaging results that were available during my care of the patient were reviewed by me and considered in my medical decision making (see chart for details).     21 year old male presents with a respiratory infection.  Has both upper and lower respiratory signs/symptoms. Treating with  Doxy and Tussionex.  Final Clinical Impressions(s) / UC Diagnoses   Final diagnoses:  Respiratory infection    New Prescriptions New Prescriptions   CHLORPHENIRAMINE-HYDROCODONE (TUSSIONEX PENNKINETIC ER) 10-8 MG/5ML SUER    Take 5 mLs by mouth every 12 (twelve) hours as needed.   DOXYCYCLINE (VIBRAMYCIN) 100 MG CAPSULE    Take 1 capsule (100 mg total) by mouth 2 (two) times daily.   Controlled Substance Prescriptions [place] Controlled Substance Registry consulted? Not Applicable   Tommie SamsCook, Draden Cottingham G, DO 09/10/17 [id number]

## 2017-09-10 NOTE — ED Triage Notes (Signed)
Patient c/o nasal congestion, cough and chest congestion for 2 weeks. Patient denies fevers.

## 2018-01-04 ENCOUNTER — Ambulatory Visit: Payer: Self-pay | Admitting: Physician Assistant

## 2018-01-04 NOTE — Progress Notes (Deleted)
       Patient: Fernando Wolfe Akre, Male    DOB: 02/01/1996, 22 y.o.   MRN: [id number] Visit Date: 01/04/2018  Today's Provider: Trey SailorsAdriana M Pollak, PA-C   No chief complaint on file.  Subjective:    New Patient to Establish Patient Care Fernando Wolfe Chandler is a 22 y.o. male who presents today for health maintenance and complete physical. He feels {DESC; WELL/FAIRLY WELL/POORLY:18703}. He reports exercising ***. He reports he is sleeping {DESC; WELL/FAIRLY WELL/POORLY:18703}.  -----------------------------------------------------------------   Review of Systems  Social History He  reports that  has never smoked. he has never used smokeless tobacco. He reports that he drinks alcohol. He reports that he does not use drugs. Social History   Socioeconomic History  . Marital status: Single    Spouse name: Not on file  . Number of children: Not on file  . Years of education: Not on file  . Highest education level: Not on file  Social Needs  . Financial resource strain: Not on file  . Food insecurity - worry: Not on file  . Food insecurity - inability: Not on file  . Transportation needs - medical: Not on file  . Transportation needs - non-medical: Not on file  Occupational History  . Not on file  Tobacco Use  . Smoking status: Never Smoker  . Smokeless tobacco: Never Used  Substance and Sexual Activity  . Alcohol use: Yes    Comment: "not much"  . Drug use: No  . Sexual activity: Not on file  Other Topics Concern  . Not on file  Social History Narrative  . Not on file    There are no active problems to display for this patient.   No past surgical history on file.  Family History  Family Status  Relation Name Status  . Mother  Alive  . Father  Alive   His family history is not on file.     No Known Allergies  Previous Medications   CHLORPHENIRAMINE-HYDROCODONE (TUSSIONEX PENNKINETIC ER) 10-8 MG/5ML SUER    Take 5 mLs by mouth every 12 (twelve) hours as needed.    DOXYCYCLINE (VIBRAMYCIN) 100 MG CAPSULE    Take 1 capsule (100 mg total) by mouth 2 (two) times daily.   IBUPROFEN (ADVIL,MOTRIN) 600 MG TABLET    Take 1 tablet (600 mg total) by mouth every 8 (eight) hours as needed.    Patient Care Team: Maryella ShiversPollak, Adriana M, PA-C as PCP - General (Physician Assistant)      Objective:   Vitals: There were no vitals taken for this visit.   Physical Exam   Depression Screen No flowsheet data found.    Assessment & Plan:     Routine Health Maintenance and Physical Exam  Exercise Activities and Dietary recommendations Goals    None       There is no immunization history on file for this patient.  There are no preventive care reminders to display for this patient.   Discussed health benefits of physical activity, and encouraged him to engage in regular exercise appropriate for his age and condition.    --------------------------------------------------------------------

## 2018-05-12 ENCOUNTER — Other Ambulatory Visit: Payer: Self-pay

## 2018-05-12 ENCOUNTER — Emergency Department: Payer: Self-pay

## 2018-05-12 ENCOUNTER — Emergency Department
Admission: EM | Admit: 2018-05-12 | Discharge: 2018-05-12 | Disposition: A | Discharge status: Home / Self Care | Payer: Self-pay | Attending: Emergency Medicine | Admitting: Emergency Medicine

## 2018-05-12 DIAGNOSIS — R000 Tachycardia, unspecified: Secondary | ICD-10-CM | POA: Insufficient documentation

## 2018-05-12 DIAGNOSIS — Z5321 Procedure and treatment not carried out due to patient leaving prior to being seen by health care provider: Secondary | ICD-10-CM | POA: Insufficient documentation

## 2018-05-12 LAB — TROPONIN I

## 2018-05-12 LAB — BASIC METABOLIC PANEL
ANION GAP: 9 (ref 5–15)
BUN: 11 mg/dL (ref 6–20)
CO2: 23 mmol/L (ref 22–32)
CREATININE: 0.91 mg/dL (ref 0.61–1.24)
Calcium: 9.2 mg/dL (ref 8.9–10.3)
Chloride: 109 mmol/L (ref 98–111)
GFR calc Af Amer: >60 mL/min (ref >60)
GLUCOSE: 107 mg/dL — AB (ref 70–99)
Potassium: 3.7 mmol/L (ref 3.5–5.1)
Sodium: 141 mmol/L (ref 135–145)

## 2018-05-12 LAB — CBC
HCT: 47 % (ref 40.0–52.0)
Hemoglobin: 16.7 g/dL (ref 13.0–18.0)
MCH: 30.7 pg (ref 26.0–34.0)
MCHC: 35.5 g/dL (ref 32.0–36.0)
MCV: 86.6 fL (ref 80.0–100.0)
PLATELETS: 225 10*3/uL (ref 150–440)
RBC: 5.43 MIL/uL (ref 4.40–5.90)
RDW: 12.8 % (ref 11.5–14.5)
WBC: 5.5 10*3/uL (ref 3.8–10.6)

## 2018-05-12 NOTE — ED Notes (Signed)
Pt still not present in waiting room 

## 2018-05-12 NOTE — ED Triage Notes (Addendum)
Patient reports feeling like heart racing for several hours after using cocaine.

## 2018-05-12 NOTE — ED Notes (Signed)
Pt not present when rounding in lobby

## 2018-05-13 ENCOUNTER — Telehealth: Payer: Self-pay | Admitting: Emergency Medicine

## 2018-05-13 NOTE — Telephone Encounter (Signed)
Called patient due to lwot to inquire about condition and follow up plans. He says he does not have any pain now.  He asked about the lab tests. I explained that he really needs to see a doctor to rule out heart problem even with normal labwork.  He says he does not have pcp.  I told him he could be seen here or he can go to [facility] or [facility] to have md exam.

## 2018-06-20 ENCOUNTER — Emergency Department
Admission: EM | Admit: 2018-06-20 | Discharge: 2018-06-20 | Disposition: A | Discharge status: Home / Self Care | Payer: Medicaid Other | Attending: Emergency Medicine | Admitting: Emergency Medicine

## 2018-06-20 ENCOUNTER — Other Ambulatory Visit: Payer: Self-pay

## 2018-06-20 ENCOUNTER — Encounter: Payer: Self-pay | Admitting: Emergency Medicine

## 2018-06-20 DIAGNOSIS — Z79899 Other long term (current) drug therapy: Secondary | ICD-10-CM | POA: Insufficient documentation

## 2018-06-20 DIAGNOSIS — Y9389 Activity, other specified: Secondary | ICD-10-CM | POA: Insufficient documentation

## 2018-06-20 DIAGNOSIS — W57XXXA Bitten or stung by nonvenomous insect and other nonvenomous arthropods, initial encounter: Secondary | ICD-10-CM | POA: Insufficient documentation

## 2018-06-20 DIAGNOSIS — S80861A Insect bite (nonvenomous), right lower leg, initial encounter: Secondary | ICD-10-CM | POA: Insufficient documentation

## 2018-06-20 DIAGNOSIS — Y929 Unspecified place or not applicable: Secondary | ICD-10-CM | POA: Insufficient documentation

## 2018-06-20 DIAGNOSIS — Y999 Unspecified external cause status: Secondary | ICD-10-CM | POA: Insufficient documentation

## 2018-06-20 MED ORDER — HYDROCORTISONE 2.5 % EX OINT
TOPICAL_OINTMENT | Freq: Two times a day (BID) | CUTANEOUS | 0 refills | Status: DC
Start: 2018-06-20 — End: 2018-07-13

## 2018-06-20 NOTE — ED Provider Notes (Signed)
[facility] Emergency Department Provider Note  ____________________________________________  Time seen: Approximately 9:30 PM  I have reviewed the triage vital signs and the nursing notes.   HISTORY  Chief Complaint Insect Bite   HPI Fernando Wolfe is a 22 y.o. male to the emergency department for treatment and evaluation of insect bite.  He noticed the area 2 days ago and complains of itching and burning sensation and feels that his lower leg is swollen.  No alleviating measures have been attempted prior to arrival.   History reviewed. No pertinent past medical history.  There are no active problems to display for this patient.   History reviewed. No pertinent surgical history.  Prior to Admission medications   Medication Sig Start Date End Date Taking? Authorizing Provider  chlorpheniramine-HYDROcodone (TUSSIONEX PENNKINETIC ER) 10-8 MG/5ML SUER Take 5 mLs by mouth every 12 (twelve) hours as needed. 09/10/17   Tommie Sams, DO  doxycycline (VIBRAMYCIN) 100 MG capsule Take 1 capsule (100 mg total) by mouth 2 (two) times daily. 09/10/17   Tommie Sams, DO  hydrocortisone 2.5 % ointment Apply topically 2 (two) times daily. 06/20/18   Josetta Wigal B, FNP  ibuprofen (ADVIL,MOTRIN) 600 MG tablet Take 1 tablet (600 mg total) by mouth every 8 (eight) hours as needed. 10/13/16   Tommi Rumps, PA-C    Allergies Patient has no known allergies.  No family history on file.  Social History Social History   Tobacco Use  . Smoking status: Never Smoker  . Smokeless tobacco: Never Used  Substance Use Topics  . Alcohol use: Yes    Comment: "not much"  . Drug use: No    Review of Systems  Constitutional: Negative for fever. Respiratory: Negative for cough or shortness of breath.  Musculoskeletal: Negative for myalgias Skin: Positive for insect bite right lower extremity Neurological: Negative for numbness or  paresthesias. ____________________________________________   PHYSICAL EXAM:  VITAL SIGNS: ED Triage Vitals  Enc Vitals Group     BP 06/20/18 2029 136/83     Pulse Rate 06/20/18 2029 90     Resp 06/20/18 2029 20     Temp 06/20/18 2029 98.9 F (37.2 C)     Temp Source 06/20/18 2029 Oral     SpO2 06/20/18 2029 99 %     Weight 06/20/18 2028 220 lb (99.8 kg)     Height 06/20/18 2028 5\' 11"  (1.803 m)     Head Circumference --      Peak Flow --      Pain Score 06/20/18 2028 0     Pain Loc --      Pain Edu? --      Excl. in GC? --      Constitutional: Well appearing. Eyes: Conjunctivae are clear without discharge or drainage. Nose: No rhinorrhea noted. Mouth/Throat: Airway is patent.  Neck: No stridor. Unrestricted range of motion observed. Cardiovascular: Capillary refill is <3 seconds.  Respiratory: Respirations are even and unlabored.. Musculoskeletal: Unrestricted range of motion observed. Neurologic: Awake, alert, and oriented x 4.  Skin: Half centimeter annular, erythematous lesion noted on the right lower extremity.  No fluctuance or induration in the surrounding area.  No edema of the extremity appreciated on exam.  ____________________________________________   LABS (all labs ordered are listed, but only abnormal results are displayed)  Labs Reviewed - No data to display ____________________________________________  EKG  Not indicated. ____________________________________________  RADIOLOGY  Not indicated ____________________________________________   PROCEDURES  Procedures ____________________________________________  INITIAL IMPRESSION / ASSESSMENT AND PLAN / ED COURSE  Tobey GrimHumberto Sherlon HandingRodriguez is a 22 y.o. male who presents to the emergency department for treatment and evaluation of a lesion suspected to be an insect bite to the right lower extremity.  He will be given a prescription for hydrocortisone cream and encouraged to follow-up with the primary  care provider for choice for symptoms that are not improving over the next few days.  He was advised he could return to the emergency department for symptoms of change or worsen if he is unable to schedule an appointment.   Medications - No data to display   Pertinent labs & imaging results that were available during my care of the patient were reviewed by me and considered in my medical decision making (see chart for details).  ____________________________________________   FINAL CLINICAL IMPRESSION(S) / ED DIAGNOSES  Final diagnoses:  Insect bite of right lower leg, initial encounter    ED Discharge Orders         Ordered    hydrocortisone 2.5 % ointment  2 times daily     06/20/18 2126           Note:  This document was prepared using Dragon voice recognition software and may include unintentional dictation errors.    Chinita Pesterriplett, Zoila Ditullio B, FNP 06/20/18 2133    Dionne BucySiadecki, Sebastian, MD 06/20/18 2304

## 2018-06-20 NOTE — ED Triage Notes (Signed)
Patient ambulatory to triage with steady gait, without difficulty or distress noted; pt reports ?insect bite noted 2 days ago with itching/burning; now with swelling noted to right lower leg

## 2018-06-20 NOTE — ED Notes (Signed)
Pt left w/o receiving DC instructions. 

## 2018-07-13 ENCOUNTER — Encounter: Payer: Self-pay | Admitting: Emergency Medicine

## 2018-07-13 ENCOUNTER — Other Ambulatory Visit: Payer: Self-pay

## 2018-07-13 ENCOUNTER — Emergency Department
Admission: EM | Admit: 2018-07-13 | Discharge: 2018-07-13 | Disposition: A | Discharge status: Home / Self Care | Payer: Self-pay | Source: Home / Self Care | Attending: Family Medicine | Admitting: Family Medicine

## 2018-07-13 DIAGNOSIS — S39012A Strain of muscle, fascia and tendon of lower back, initial encounter: Secondary | ICD-10-CM

## 2018-07-13 DIAGNOSIS — B9789 Other viral agents as the cause of diseases classified elsewhere: Secondary | ICD-10-CM

## 2018-07-13 DIAGNOSIS — S76019A Strain of muscle, fascia and tendon of unspecified hip, initial encounter: Secondary | ICD-10-CM

## 2018-07-13 DIAGNOSIS — J069 Acute upper respiratory infection, unspecified: Secondary | ICD-10-CM

## 2018-07-13 DIAGNOSIS — Z711 Person with feared health complaint in whom no diagnosis is made: Secondary | ICD-10-CM

## 2018-07-13 LAB — POCT URINALYSIS DIP (MANUAL ENTRY)
Bilirubin, UA: NEGATIVE
Glucose, UA: NEGATIVE mg/dL
Ketones, POC UA: NEGATIVE mg/dL
Nitrite, UA: NEGATIVE
PROTEIN UA: NEGATIVE mg/dL
RBC UA: NEGATIVE
Spec Grav, UA: 1.015 (ref 1.010–1.025)
Urobilinogen, UA: 0.2 E.U./dL
pH, UA: 6.5 (ref 5.0–8.0)

## 2018-07-13 MED ORDER — PREDNISONE 20 MG PO TABS: ORAL_TABLET | ORAL | 0 refills | Status: DC

## 2018-07-13 NOTE — Discharge Instructions (Addendum)
Apply ice pack to painful areas for 20 to 30 minutes, 3 to 4 times daily  Continue until pain and swelling decrease.  Begin range of motion and stretching exercises as tolerated.

## 2018-07-13 NOTE — ED Provider Notes (Signed)
[facility]    CSN: [id number] Arrival date & time: 07/13/18  1229     History   Chief Complaint Chief Complaint  Patient presents with  . Flank Pain  . Exposure to STD    HPI Fernando Wolfe is a 21 y.o. male.   Patient presents with several complaints: 1)  He is concerned that he may have a right kidney stone.  He has had intermittent pain in his right back and right inguinal area for about a month, although he denies urinary symptoms.  No hematuria.  The pain does not radiate.  He states that the pain is worse with movement.  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 2)  He wishes to be checked for STD, although he has not had urethral discharge, dysuria, frequency, rash, or testicular pain/swelling. 3)  He states that he developed cold-like symptoms about a month ago, but now has a persistent mild cough without chest pain, wheezing, or shortness of breath.  No fevers, chills, and sweats.  He has a past history of asthma as a child.  The history is provided by the patient.    History reviewed. No pertinent past medical history.  There are no active problems to display for this patient.   History reviewed. No pertinent surgical history.     Home Medications    Prior to Admission medications   Medication Sig Start Date End Date Taking? Authorizing Provider  ibuprofen (ADVIL,MOTRIN) 600 MG tablet Take 1 tablet (600 mg total) by mouth every 8 (eight) hours as needed. 10/13/16   Tommi Rumps, PA-C  predniSONE (DELTASONE) 20 MG tablet Take one tab by mouth twice daily for 4 days, then one daily for 3 days. Take with food. 07/13/18   Lattie Haw, MD    Family History No family history on file.  Social History Social History   Tobacco Use  . Smoking status: Never Smoker  . Smokeless tobacco: Never Used  Substance Use Topics  . Alcohol use: Yes    Comment: "not much"  . Drug use: No     Allergies     Patient has no known allergies.   Review of Systems Review of Systems  Constitutional: Negative for activity change, appetite change, chills, diaphoresis, fatigue and fever.  HENT: Negative.   Eyes: Negative.   Respiratory: Positive for cough. Negative for chest tightness, shortness of breath and wheezing.   Cardiovascular: Negative.   Gastrointestinal: Positive for abdominal pain.  Genitourinary: Positive for flank pain. Negative for difficulty urinating, discharge, dysuria, frequency, genital sores, hematuria, penile pain, penile swelling, scrotal swelling, testicular pain and urgency.  Musculoskeletal: Positive for back pain.  Skin: Negative.   Neurological: Negative.      Physical Exam Triage Vital Signs ED Triage Vitals  Enc Vitals Group     BP 07/13/18 1300 121/79     Pulse Rate 07/13/18 1300 71     Resp 07/13/18 1300 16     Temp 07/13/18 1300 98.2 F (36.8 C)     Temp Source 07/13/18 1300 Oral     SpO2 07/13/18 1300 100 %     Weight 07/13/18 1301 220 lb (99.8 kg)     Height 07/13/18 1301 5\' 11"  (1.803 m)     Head Circumference --      Peak Flow --      Pain Score 07/13/18 1300 3     Pain Loc --  Pain Edu? --      Excl. in GC? --    No data found.  Updated Vital Signs BP 121/79 (BP Location: Right Arm)   Pulse 71   Temp 98.2 F (36.8 C) (Oral)   Resp 16   Ht 5\' 11"  (1.803 m)   Wt 99.8 kg   SpO2 100%   BMI 30.68 kg/m   Visual Acuity Right Eye Distance:   Left Eye Distance:   Bilateral Distance:    Right Eye Near:   Left Eye Near:    Bilateral Near:     Physical Exam  Constitutional: He appears well-developed and well-nourished. No distress.  HENT:  Head: Normocephalic.  Right Ear: Tympanic membrane, external ear and ear canal normal.  Left Ear: Tympanic membrane, external ear and ear canal normal.  Nose: Nose normal.  Mouth/Throat: Oropharynx is clear and moist.  Eyes: Pupils are equal, round, and reactive to light. Conjunctivae and EOM  are normal.  Neck: Neck supple.  Mildly tender lateral nodes.  Cardiovascular: Normal heart sounds.  Pulmonary/Chest: Breath sounds normal.  Abdominal: Soft.  Musculoskeletal: He exhibits no edema.       Right hip: He exhibits tenderness.       Lumbar back: He exhibits tenderness. He exhibits normal range of motion.       Back:       Legs: There is tenderness to palpation over the right hip flexors.  Pain is elicited by palpation there during resisted flexion of his right hip.  Back:  Range of motion well preserved.  Can heel/toe walk and squat without difficulty. Mild tenderness in the right paraspinous muscles from L3 to Sacral area.  Straight leg raising test is negative.  Sitting knee extension test is negative.  Strength and sensation in the lower extremities is normal.  Patellar and achilles reflexes are normal   Neurological: He is alert.  Skin: Skin is warm and dry. No rash noted.  Nursing note and vitals reviewed.     UC Treatments / Results  Labs (all labs ordered are listed, but only abnormal results are displayed) Labs Reviewed  POCT URINALYSIS DIP (MANUAL ENTRY) - Abnormal; Notable for the following components:      Result Value   Color, UA light yellow (*)    Leukocytes, UA Small (1+) (*)    All other components within normal limits  C. TRACHOMATIS/N. GONORRHOEAE RNA  URINE CULTURE  RPR  HEPATITIS PANEL, ACUTE  HIV ANTIBODY (ROUTINE TESTING)    EKG None  Radiology No results found.  Procedures Procedures (including critical care time)  Medications Ordered in UC Medications - No data to display  Initial Impression / Assessment and Plan / UC Course  I have reviewed the triage vital signs and the nursing notes.  Pertinent labs & imaging results that were available during my care of the patient were reviewed by me and considered in my medical decision making (see chart for details).    No evidence nephrolithias.  Urine culture pending. Check HIV atb,  RPR, GC/chlamydia. Suspect muscle strains from work-related activities. No evidence bacterial respiratory infection; suspect resolving viral URI.  Begin prednisone burst taper for chest congestion (past history of asthma). Followup with Dr. Rodney Langton or Dr. Clementeen [facility] ([facility]) if not improving about two weeks.    Final Clinical Impressions(s) / UC Diagnoses   Final diagnoses:  Strain of flexor muscle of hip, initial encounter  Low back strain, initial encounter  Viral URI with  cough  Concern about STD in male without diagnosis     Discharge Instructions     Apply ice pack to painful areas for 20 to 30 minutes, 3 to 4 times daily  Continue until pain and swelling decrease.  Begin range of motion and stretching exercises as tolerated.    ED Prescriptions    Medication Sig Dispense Auth. Provider   predniSONE (DELTASONE) 20 MG tablet Take one tab by mouth twice daily for 4 days, then one daily for 3 days. Take with food. 11 tablet Lattie HawBeese, Bridget Westbrooks A, MD        Lattie HawBeese, Burch Marchuk A, MD 07/16/18 1314

## 2018-07-13 NOTE — ED Triage Notes (Signed)
Patient is having some bilateral flank pain and some right lower abdominal pain; wants to be checked for kidney stones and STDs.

## 2018-07-14 LAB — URINE CULTURE
MICRO NUMBER:: 91046676
Result:: NO GROWTH
SPECIMEN QUALITY:: ADEQUATE

## 2018-07-16 LAB — HEPATITIS PANEL, ACUTE
HEP A IGM: NONREACTIVE
HEP B C IGM: NONREACTIVE
HEP C AB: NONREACTIVE
Hepatitis B Surface Ag: NONREACTIVE
SIGNAL TO CUT-OFF: 0.05 (ref <1.00)

## 2018-07-16 LAB — HIV ANTIBODY (ROUTINE TESTING W REFLEX): HIV 1&2 Ab, 4th Generation: NONREACTIVE

## 2018-07-16 LAB — RPR: RPR: NONREACTIVE

## 2018-07-17 ENCOUNTER — Telehealth: Payer: Self-pay | Admitting: Emergency Medicine

## 2018-07-17 LAB — C. TRACHOMATIS/N. GONORRHOEAE RNA
C. trachomatis RNA, TMA: NOT DETECTED
N. GONORRHOEAE RNA, TMA: NOT DETECTED

## 2018-09-25 ENCOUNTER — Emergency Department: Payer: Self-pay

## 2018-09-25 ENCOUNTER — Encounter: Payer: Self-pay | Admitting: Emergency Medicine

## 2018-09-25 ENCOUNTER — Other Ambulatory Visit: Payer: Self-pay

## 2018-09-25 ENCOUNTER — Emergency Department
Admission: EM | Admit: 2018-09-25 | Discharge: 2018-09-25 | Disposition: A | Discharge status: Home / Self Care | Payer: Self-pay | Attending: Student in an Organized Health Care Education/Training Program | Admitting: Student in an Organized Health Care Education/Training Program

## 2018-09-25 DIAGNOSIS — M545 Low back pain, unspecified: Secondary | ICD-10-CM

## 2018-09-25 DIAGNOSIS — B3742 Candidal balanitis: Secondary | ICD-10-CM | POA: Insufficient documentation

## 2018-09-25 DIAGNOSIS — Z79899 Other long term (current) drug therapy: Secondary | ICD-10-CM | POA: Insufficient documentation

## 2018-09-25 LAB — URINALYSIS, COMPLETE (UACMP) WITH MICROSCOPIC
BACTERIA UA: NONE SEEN
Bilirubin Urine: NEGATIVE
Glucose, UA: NEGATIVE mg/dL
HGB URINE DIPSTICK: NEGATIVE
Ketones, ur: NEGATIVE mg/dL
NITRITE: NEGATIVE
Protein, ur: 100 mg/dL — AB
SPECIFIC GRAVITY, URINE: 1.003 — AB (ref 1.005–1.030)
SQUAMOUS EPITHELIAL / LPF: NONE SEEN (ref 0–5)
pH: 6 (ref 5.0–8.0)

## 2018-09-25 LAB — LIPASE, BLOOD: LIPASE: 28 U/L (ref 11–51)

## 2018-09-25 LAB — CBC
HEMATOCRIT: 43.6 % (ref 39.0–52.0)
Hemoglobin: 15.6 g/dL (ref 13.0–17.0)
MCH: 30.1 pg (ref 26.0–34.0)
MCHC: 35.8 g/dL (ref 30.0–36.0)
MCV: 84 fL (ref 80.0–100.0)
Platelets: 202 10*3/uL (ref 150–400)
RBC: 5.19 MIL/uL (ref 4.22–5.81)
RDW: 11.8 % (ref 11.5–15.5)
WBC: 12.1 10*3/uL — AB (ref 4.0–10.5)
nRBC: 0 % (ref 0.0–0.2)

## 2018-09-25 LAB — COMPREHENSIVE METABOLIC PANEL
ALBUMIN: 4.6 g/dL (ref 3.5–5.0)
ALT: 27 U/L (ref 0–44)
AST: 25 U/L (ref 15–41)
Alkaline Phosphatase: 121 U/L (ref 38–126)
Anion gap: 9 (ref 5–15)
BUN: 14 mg/dL (ref 6–20)
CHLORIDE: 104 mmol/L (ref 98–111)
CO2: 27 mmol/L (ref 22–32)
CREATININE: 1.44 mg/dL — AB (ref 0.61–1.24)
Calcium: 9.5 mg/dL (ref 8.9–10.3)
GFR calc Af Amer: >60 mL/min (ref >60)
Glucose, Bld: 92 mg/dL (ref 70–99)
POTASSIUM: 3.7 mmol/L (ref 3.5–5.1)
SODIUM: 140 mmol/L (ref 135–145)
TOTAL PROTEIN: 8.2 g/dL — AB (ref 6.5–8.1)
Total Bilirubin: 0.9 mg/dL (ref 0.3–1.2)

## 2018-09-25 LAB — CK: CK TOTAL: 152 U/L (ref 49–397)

## 2018-09-25 MED ORDER — IOPAMIDOL (ISOVUE-300) INJECTION 61%
100.0000 mL | Freq: Once | INTRAVENOUS | Status: AC | PRN
  Administered 2018-09-25: 100 mL via INTRAVENOUS
  Filled 2018-09-25: qty 100

## 2018-09-25 MED ORDER — CYCLOBENZAPRINE HCL 5 MG PO TABS: 5.0000 mg | ORAL_TABLET | Freq: Three times a day (TID) | ORAL | 0 refills | Status: DC | PRN

## 2018-09-25 MED ORDER — HYDROCODONE-ACETAMINOPHEN 5-325 MG PO TABS: 1.0000 | ORAL_TABLET | ORAL | 0 refills | Status: DC | PRN

## 2018-09-25 MED ORDER — SODIUM CHLORIDE 0.9 % IV BOLUS
1000.0000 mL | Freq: Once | INTRAVENOUS | Status: AC
  Administered 2018-09-25: 1000 mL via INTRAVENOUS

## 2018-09-25 MED ORDER — AZITHROMYCIN 500 MG PO TABS
1000.0000 mg | ORAL_TABLET | Freq: Once | ORAL | Status: AC
  Administered 2018-09-25: 1000 mg via ORAL
  Filled 2018-09-25: qty 2

## 2018-09-25 MED ORDER — MORPHINE SULFATE (PF) 4 MG/ML IV SOLN
4.0000 mg | INTRAVENOUS | Status: DC | PRN
  Administered 2018-09-25: 4 mg via INTRAVENOUS
  Filled 2018-09-25: qty 1

## 2018-09-25 MED ORDER — ONDANSETRON HCL 4 MG/2ML IJ SOLN
4.0000 mg | Freq: Once | INTRAMUSCULAR | Status: AC | PRN
  Administered 2018-09-25: 4 mg via INTRAVENOUS
  Filled 2018-09-25: qty 2

## 2018-09-25 MED ORDER — DEXTROSE 5 % IV SOLN
250.0000 mg | Freq: Once | INTRAVENOUS | Status: AC
  Administered 2018-09-25: 250 mg via INTRAVENOUS
  Filled 2018-09-25: qty 250

## 2018-09-25 MED ORDER — MICONAZOLE NITRATE 2 % EX CREA: 1.0000 "application " | TOPICAL_CREAM | Freq: Two times a day (BID) | CUTANEOUS | 0 refills | Status: AC

## 2018-09-25 NOTE — ED Triage Notes (Signed)
Pt very uncomfortable in triage. Pt states he woke up with abdominal pain that is on both left and right lower quadrants and radiates to his lower back. Pt reports normal bowel pattern.

## 2018-09-25 NOTE — ED Notes (Signed)
AAOx3.  Skin warm and dry. NAD.  C/O left lower back pain and mid abdominal pain. STates has had back pain for "a long time". Describes pain as intermittent.  States abdominal pain began today. Abdomen is soft and non tender.  C/O pain mid abdomen.

## 2018-09-25 NOTE — ED Notes (Signed)
Lab called and notified of CK add on

## 2018-09-25 NOTE — ED Notes (Signed)
Patient transported to CT 

## 2018-09-25 NOTE — ED Provider Notes (Signed)
[facility] Emergency Department Provider Note    First MD Initiated Contact with Patient 09/25/18 1743     (approximate)  I have reviewed the triage vital signs and the nursing notes.   HISTORY  Chief Complaint Abdominal Pain    HPI Fernando Wolfe is a 22 y.o. male presents the ER for evaluation of back pain is well as generalized abdominal pain.  States he had back pain for quite some time and is intermittent.  Really worsened with movement but started having abdominal pain associated with nausea.  No vomiting.  Denies any fevers or chills.  Is never had pain like this before.  Having difficulty walking due to pain.  Admits to decreased appetite.    History reviewed. No pertinent past medical history. No family history on file. History reviewed. No pertinent surgical history. There are no active problems to display for this patient.     Prior to Admission medications   Medication Sig Start Date End Date Taking? Authorizing Provider  cyclobenzaprine (FLEXERIL) 5 MG tablet Take 1 tablet (5 mg total) by mouth 3 (three) times daily as needed for muscle spasms. 09/25/18   Willy Eddyobinson, Aviana Shevlin, MD  HYDROcodone-acetaminophen (NORCO) 5-325 MG tablet Take 1 tablet by mouth every 4 (four) hours as needed for moderate pain. 09/25/18   Willy Eddyobinson, Tevin Shillingford, MD  ibuprofen (ADVIL,MOTRIN) 600 MG tablet Take 1 tablet (600 mg total) by mouth every 8 (eight) hours as needed. 10/13/16   Tommi RumpsSummers, Rhonda L, PA-C  miconazole (MICOTIN) 2 % cream Apply 1 application topically 2 (two) times daily for 5 days. Apply to head of penis and foreskin twice daily 09/25/18 09/30/18  Willy Eddyobinson, Solyana Nonaka, MD  predniSONE (DELTASONE) 20 MG tablet Take one tab by mouth twice daily for 4 days, then one daily for 3 days. Take with food. 07/13/18   Lattie HawBeese, Stephen A, MD    Allergies Patient has no known allergies.    Social History Social History   Tobacco Use  . Smoking status: Never Smoker   . Smokeless tobacco: Never Used  Substance Use Topics  . Alcohol use: Yes    Comment: "not much"  . Drug use: No    Review of Systems Patient denies headaches, rhinorrhea, blurry vision, numbness, shortness of breath, chest pain, edema, cough, abdominal pain, nausea, vomiting, diarrhea, dysuria, fevers, rashes or hallucinations unless otherwise stated above in HPI. ____________________________________________   PHYSICAL EXAM:  VITAL SIGNS: Vitals:   09/25/18 1657 09/25/18 1658  BP:  140/88  Pulse: 93   Resp: 20   Temp: (!) 97.5 F (36.4 C)   SpO2: 100%     Constitutional: Alert and oriented.  Eyes: Conjunctivae are normal.  Head: Atraumatic. Nose: No congestion/rhinnorhea. Mouth/Throat: Mucous membranes are moist.   Neck: No stridor. Painless ROM.  Cardiovascular: Normal rate, regular rhythm. Grossly normal heart sounds.  Good peripheral circulation. Respiratory: Normal respiratory effort.  No retractions. Lungs CTAB. Gastrointestinal: Soft with generalized ttp particularly in periumbilical region and RLQ. No distention. No abdominal bruits. No CVA tenderness. Genitourinary: deferred Musculoskeletal: No lower extremity tenderness nor edema.  No joint effusions. Neurologic:  Normal speech and language. No gross focal neurologic deficits are appreciated. No facial droop Skin:  Skin is warm, dry and intact. No rash noted. Psychiatric: Mood and affect are normal. Speech and behavior are normal.  ____________________________________________   LABS (all labs ordered are listed, but only abnormal results are displayed)  Results for orders placed or performed during the hospital encounter of  09/25/18 (from the past 24 hour(s))  Lipase, blood     Status: None   Collection Time: 09/25/18  5:00 PM  Result Value Ref Range   Lipase 28 11 - 51 U/L  Comprehensive metabolic panel     Status: Abnormal   Collection Time: 09/25/18  5:00 PM  Result Value Ref Range   Sodium 140 135  - 145 mmol/L   Potassium 3.7 3.5 - 5.1 mmol/L   Chloride 104 98 - 111 mmol/L   CO2 27 22 - 32 mmol/L   Glucose, Bld 92 70 - 99 mg/dL   BUN 14 6 - 20 mg/dL   Creatinine, Ser 1.61 (H) 0.61 - 1.24 mg/dL   Calcium 9.5 8.9 - 09.6 mg/dL   Total Protein 8.2 (H) 6.5 - 8.1 g/dL   Albumin 4.6 3.5 - 5.0 g/dL   AST 25 15 - 41 U/L   ALT 27 0 - 44 U/L   Alkaline Phosphatase 121 38 - 126 U/L   Total Bilirubin 0.9 0.3 - 1.2 mg/dL   GFR calc non Af Amer >60 >60 mL/min   GFR calc Af Amer >60 >60 mL/min   Anion gap 9 5 - 15  CBC     Status: Abnormal   Collection Time: 09/25/18  5:00 PM  Result Value Ref Range   WBC 12.1 (H) 4.0 - 10.5 K/uL   RBC 5.19 4.22 - 5.81 MIL/uL   Hemoglobin 15.6 13.0 - 17.0 g/dL   HCT 04.5 40.9 - 81.1 %   MCV 84.0 80.0 - 100.0 fL   MCH 30.1 26.0 - 34.0 pg   MCHC 35.8 30.0 - 36.0 g/dL   RDW 91.4 78.2 - 95.6 %   Platelets 202 150 - 400 K/uL   nRBC 0.0 0.0 - 0.2 %  Urinalysis, Complete w Microscopic     Status: Abnormal   Collection Time: 09/25/18  5:00 PM  Result Value Ref Range   Color, Urine STRAW (A) YELLOW   APPearance CLEAR (A) CLEAR   Specific Gravity, Urine 1.003 (L) 1.005 - 1.030   pH 6.0 5.0 - 8.0   Glucose, UA NEGATIVE NEGATIVE mg/dL   Hgb urine dipstick NEGATIVE NEGATIVE   Bilirubin Urine NEGATIVE NEGATIVE   Ketones, ur NEGATIVE NEGATIVE mg/dL   Protein, ur 213 (A) NEGATIVE mg/dL   Nitrite NEGATIVE NEGATIVE   Leukocytes, UA TRACE (A) NEGATIVE   RBC / HPF 0-5 0 - 5 RBC/hpf   WBC, UA 0-5 0 - 5 WBC/hpf   Bacteria, UA NONE SEEN NONE SEEN   Squamous Epithelial / LPF NONE SEEN 0 - 5   Mucus PRESENT   CK     Status: None   Collection Time: 09/25/18  5:00 PM  Result Value Ref Range   Total CK 152 49 - 397 U/L   ____________________________________________ ____________________________________________  RADIOLOGY  I personally reviewed all radiographic images ordered to evaluate for the above acute complaints and reviewed radiology reports and findings.   These findings were personally discussed with the patient.  Please see medical record for radiology report.  ____________________________________________   PROCEDURES  Procedure(s) performed:  Procedures    Critical Care performed: no ____________________________________________   INITIAL IMPRESSION / ASSESSMENT AND PLAN / ED COURSE  Pertinent labs & imaging results that were available during my care of the patient were reviewed by me and considered in my medical decision making (see chart for details).   DDX: colitis, appy, uti, ibd, diverticulitis, abscess  Fernando Wolfe is a  22 y.o. who presents to the ED with his as described above.  Seems to be primarily muscular skeletal pain.  Does have some mild abdominal pain however as well and will order CT imaging to exclude referred pain from appendicitis or other intra-abdominal process.  We will give IV pain medication as well as IV fluids.  The patient will be placed on continuous pulse oximetry and telemetry for monitoring.  Laboratory evaluation will be sent to evaluate for the above complaints.     Clinical Course as of Sep 25 2042  Wed Sep 25, 2018  1911 With spouse out of the room patient did admit to being sexually active multiple sexual partners.  Would like to be tested and treated for STD and  STI.    [PR]  1920 Does look like he has some candidal balanitis as well on exam.  CK is normal.  Does have mildly elevated creatinine but remainder of electrolytes are normal.  Does have trace protein on his urine.  Will send for culture.  No anatomic abnormality on CT imaging of the kidneys. we will continue with IV hydration and trial transition to ioral hydration.Likely muscular skeletal pain.   [PR]  1924 Patient was able to tolerate PO and was able to ambulate with a steady gait.    [PR]    Clinical Course User Index [PR] Willy Eddy, MD     As part of my medical decision making, I reviewed the following data  within the electronic MEDICAL RECORD NUMBER Nursing notes reviewed and incorporated, Labs reviewed, notes from prior ED visits.  ____________________________________________   FINAL CLINICAL IMPRESSION(S) / ED DIAGNOSES  Final diagnoses:  Acute left-sided low back pain without sciatica  Candidal balanitis      NEW MEDICATIONS STARTED DURING THIS VISIT:  New Prescriptions   CYCLOBENZAPRINE (FLEXERIL) 5 MG TABLET    Take 1 tablet (5 mg total) by mouth 3 (three) times daily as needed for muscle spasms.   HYDROCODONE-ACETAMINOPHEN (NORCO) 5-325 MG TABLET    Take 1 tablet by mouth every 4 (four) hours as needed for moderate pain.   MICONAZOLE (MICOTIN) 2 % CREAM    Apply 1 application topically 2 (two) times daily for 5 days. Apply to head of penis and foreskin twice daily     Note:  This document was prepared using Dragon voice recognition software and may include unintentional dictation errors.    Willy Eddy, MD 09/25/18 2043

## 2018-09-25 NOTE — Discharge Instructions (Addendum)
Your kidney function was mildly elevated today likely from some dehydration but it is important that you follow-up with your primary doctor for repeat blood check to ensure that is clearing with hydration.  Apply topical miconazole cream twice daily.  Return for any additional questions or concerns.  Return for fevers  You have been seen in the emergency department for emergency care. It is important that you contact your own doctor, specialist or the closest clinic for follow-up care. Please bring this instruction sheet, all medications and X-ray copies with you when you are seen for follow-up care.  Determining the exact cause for all patients with abdominal pain is extremely difficult in the emergency department. Our primary focus is to rule-out immediate life-threatening diseases. If no immediate source of pain is found the definitive diagnosis frequently needs to be determined over time.Many times your primary care physician can determine the cause by following the symptoms over time. Sometimes, specialist are required such as Gastroenterologists, Gynecologists, Urologists or Surgeons. Please return immediately to the Emergency Department for fever>101, Vomiting or Intractable Pain. You should return to the emergency department or see your primary care provider in 12-24hrs if your pain is no better and sooner if your pain becomes worse.

## 2018-10-02 ENCOUNTER — Other Ambulatory Visit: Payer: Self-pay

## 2018-10-02 ENCOUNTER — Encounter: Payer: Self-pay | Admitting: Emergency Medicine

## 2018-10-02 ENCOUNTER — Inpatient Hospital Stay
Admission: EM | Admit: 2018-10-02 | Discharge: 2018-10-03 | DRG: 684 | Disposition: A | Discharge status: Home / Self Care | Payer: Self-pay | Attending: Specialist | Admitting: Specialist

## 2018-10-02 ENCOUNTER — Inpatient Hospital Stay: Payer: Self-pay

## 2018-10-02 DIAGNOSIS — N170 Acute kidney failure with tubular necrosis: Principal | ICD-10-CM | POA: Diagnosis present

## 2018-10-02 DIAGNOSIS — Z91041 Radiographic dye allergy status: Secondary | ICD-10-CM

## 2018-10-02 DIAGNOSIS — Z79899 Other long term (current) drug therapy: Secondary | ICD-10-CM

## 2018-10-02 DIAGNOSIS — K589 Irritable bowel syndrome without diarrhea: Secondary | ICD-10-CM | POA: Diagnosis present

## 2018-10-02 DIAGNOSIS — M549 Dorsalgia, unspecified: Secondary | ICD-10-CM | POA: Diagnosis present

## 2018-10-02 DIAGNOSIS — I10 Essential (primary) hypertension: Secondary | ICD-10-CM | POA: Diagnosis present

## 2018-10-02 DIAGNOSIS — Z791 Long term (current) use of non-steroidal anti-inflammatories (NSAID): Secondary | ICD-10-CM

## 2018-10-02 DIAGNOSIS — N179 Acute kidney failure, unspecified: Secondary | ICD-10-CM | POA: Diagnosis present

## 2018-10-02 LAB — URINALYSIS, COMPLETE (UACMP) WITH MICROSCOPIC
BACTERIA UA: NONE SEEN
BILIRUBIN URINE: NEGATIVE
Glucose, UA: NEGATIVE mg/dL
Ketones, ur: NEGATIVE mg/dL
Nitrite: NEGATIVE
Protein, ur: NEGATIVE mg/dL
SPECIFIC GRAVITY, URINE: 1.003 — AB (ref 1.005–1.030)
Squamous Epithelial / LPF: NONE SEEN (ref 0–5)
pH: 6 (ref 5.0–8.0)

## 2018-10-02 LAB — CBC WITH DIFFERENTIAL/PLATELET
ABS IMMATURE GRANULOCYTES: 0.03 10*3/uL (ref 0.00–0.07)
Basophils Absolute: 0 10*3/uL (ref 0.0–0.1)
Basophils Relative: 1 %
EOS PCT: 3 %
Eosinophils Absolute: 0.2 10*3/uL (ref 0.0–0.5)
HEMATOCRIT: 49 % (ref 39.0–52.0)
HEMOGLOBIN: 17.3 g/dL — AB (ref 13.0–17.0)
Immature Granulocytes: 0 %
LYMPHS ABS: 2.4 10*3/uL (ref 0.7–4.0)
LYMPHS PCT: 29 %
MCH: 29.8 pg (ref 26.0–34.0)
MCHC: 35.3 g/dL (ref 30.0–36.0)
MCV: 84.3 fL (ref 80.0–100.0)
MONOS PCT: 8 %
Monocytes Absolute: 0.6 10*3/uL (ref 0.1–1.0)
NEUTROS ABS: 5.1 10*3/uL (ref 1.7–7.7)
Neutrophils Relative %: 59 %
Platelets: 233 10*3/uL (ref 150–400)
RBC: 5.81 MIL/uL (ref 4.22–5.81)
RDW: 11.6 % (ref 11.5–15.5)
WBC: 8.4 10*3/uL (ref 4.0–10.5)
nRBC: 0 % (ref 0.0–0.2)

## 2018-10-02 LAB — COMPREHENSIVE METABOLIC PANEL
ALBUMIN: 4.9 g/dL (ref 3.5–5.0)
ALT: 19 U/L (ref 0–44)
AST: 19 U/L (ref 15–41)
Alkaline Phosphatase: 119 U/L (ref 38–126)
Anion gap: 12 (ref 5–15)
BUN: 24 mg/dL — AB (ref 6–20)
CHLORIDE: 101 mmol/L (ref 98–111)
CO2: 27 mmol/L (ref 22–32)
CREATININE: 2.83 mg/dL — AB (ref 0.61–1.24)
Calcium: 9.9 mg/dL (ref 8.9–10.3)
GFR calc Af Amer: 35 mL/min — ABNORMAL LOW (ref >60)
GFR, EST NON AFRICAN AMERICAN: 30 mL/min — AB (ref >60)
GLUCOSE: 105 mg/dL — AB (ref 70–99)
Potassium: 3.8 mmol/L (ref 3.5–5.1)
Sodium: 140 mmol/L (ref 135–145)
Total Bilirubin: 1 mg/dL (ref 0.3–1.2)
Total Protein: 9 g/dL — ABNORMAL HIGH (ref 6.5–8.1)

## 2018-10-02 LAB — POCT I-STAT, CHEM 8
BUN: 24 mg/dL — ABNORMAL HIGH (ref 6–20)
CALCIUM ION: 1.18 mmol/L (ref 1.15–1.40)
Chloride: 101 mmol/L (ref 98–111)
Creatinine, Ser: 3 mg/dL — ABNORMAL HIGH (ref 0.61–1.24)
GLUCOSE: 101 mg/dL — AB (ref 70–99)
HCT: 52 % (ref 39.0–52.0)
HEMOGLOBIN: 17.7 g/dL — AB (ref 13.0–17.0)
Potassium: 3.9 mmol/L (ref 3.5–5.1)
Sodium: 138 mmol/L (ref 135–145)
TCO2: 28 mmol/L (ref 22–32)

## 2018-10-02 MED ORDER — HEPARIN SODIUM (PORCINE) 5000 UNIT/ML IJ SOLN
5000.0000 [IU] | Freq: Three times a day (TID) | INTRAMUSCULAR | Status: DC
  Filled 2018-10-02: qty 1

## 2018-10-02 MED ORDER — ONDANSETRON HCL 4 MG/2ML IJ SOLN: 4.0000 mg | Freq: Four times a day (QID) | INTRAMUSCULAR | Status: DC | PRN

## 2018-10-02 MED ORDER — ACETAMINOPHEN 650 MG RE SUPP: 650.0000 mg | Freq: Four times a day (QID) | RECTAL | Status: DC | PRN

## 2018-10-02 MED ORDER — ONDANSETRON HCL 4 MG PO TABS: 4.0000 mg | ORAL_TABLET | Freq: Four times a day (QID) | ORAL | Status: DC | PRN

## 2018-10-02 MED ORDER — DICYCLOMINE HCL 20 MG PO TABS
20.0000 mg | ORAL_TABLET | ORAL | Status: DC
  Administered 2018-10-02 – 2018-10-03 (×6): 20 mg via ORAL
  Filled 2018-10-02 (×8): qty 1

## 2018-10-02 MED ORDER — ACETAMINOPHEN 325 MG PO TABS: 650.0000 mg | ORAL_TABLET | Freq: Four times a day (QID) | ORAL | Status: DC | PRN

## 2018-10-02 MED ORDER — SODIUM CHLORIDE 0.9 % IV SOLN
INTRAVENOUS | Status: DC
  Administered 2018-10-02 – 2018-10-03 (×3): via INTRAVENOUS

## 2018-10-02 NOTE — H&P (Signed)
[facility] - [place] at [facility]    PATIENT NAME: Fernando Wolfe    MR#:  [id number]  DATE OF BIRTH:  11-May-1996  DATE OF ADMISSION:  10/02/2018  PRIMARY CARE PHYSICIAN: Emogene Morgan, MD   REQUESTING/REFERRING PHYSICIAN: Dr. Dorothea Glassman  CHIEF COMPLAINT:   Chief Complaint  Patient presents with  . abnormal labs    HISTORY OF PRESENT ILLNESS:  Fernando Wolfe  is a 22 y.o. male with history of IBS who presents to the hospital due to abnormal blood work done at his primary care physician's office and he was noted to be in acute renal failure.  Patient presented to the ER about a week ago complaining of abdominal and back pain underwent a CT of the abdomen pelvis with contrast which was negative for acute pathology.  He was noted to have muscular skeletal back pain and discharged on some prednisone, Flexeril, Vicodin also Motrin as needed.  Patient says he took about 3 days of 800 mg of Motrin every 8 hours but did not improve his pain and started to take Vicodin and Flexeril done.  His back pain has improved but he went to see his primary care physician after his ER visit and they did blood work and he was noted to have a creatinine of over 2.  They recommended he come to the ER for further evaluation.  In the emergency room patient was noted to have a creatinine as high as 3.0.  Hospitalist services were contacted for admission.  Patient says he is been urinating well denies any hematuria, nausea, vomiting, fever, chills, chest pain, shortness of breath or any other associated symptoms presently.  PAST MEDICAL HISTORY:  History reviewed. No pertinent past medical history.  PAST SURGICAL HISTORY:  History reviewed. No pertinent surgical history.  SOCIAL HISTORY:   Social History   Tobacco Use  . Smoking status: Never Smoker  . Smokeless tobacco: Never Used  Substance Use Topics  . Alcohol use: Yes    Comment: "not much"    FAMILY HISTORY:  No  family history on file.  DRUG ALLERGIES:  No Known Allergies  REVIEW OF SYSTEMS:   Review of Systems  Constitutional: Negative for fever and weight loss.  HENT: Negative for congestion, nosebleeds and tinnitus.   Eyes: Negative for blurred vision, double vision and redness.  Respiratory: Negative for cough, hemoptysis and shortness of breath.   Cardiovascular: Negative for chest pain, orthopnea, leg swelling and PND.  Gastrointestinal: Negative for abdominal pain, diarrhea, melena, nausea and vomiting.  Genitourinary: Negative for dysuria, hematuria and urgency.  Musculoskeletal: Negative for falls and joint pain.  Neurological: Negative for dizziness, tingling, sensory change, focal weakness, seizures, weakness and headaches.  Endo/Heme/Allergies: Negative for polydipsia. Does not bruise/bleed easily.  Psychiatric/Behavioral: Negative for depression and memory loss. The patient is not nervous/anxious.     MEDICATIONS AT HOME:   Prior to Admission medications   Medication Sig Start Date End Date Taking? Authorizing Provider  dicyclomine (BENTYL) 20 MG tablet Take 20 mg by mouth every 4 (four) hours while awake.   Yes [provider]  HYDROcodone-acetaminophen (NORCO) 5-325 MG tablet Take 1 tablet by mouth every 4 (four) hours as needed for moderate pain. 09/25/18  Yes Willy Eddy, MD  cyclobenzaprine (FLEXERIL) 5 MG tablet Take 1 tablet (5 mg total) by mouth 3 (three) times daily as needed for muscle spasms. Patient not taking: Reported on 10/02/2018 09/25/18   Willy Eddy, MD  ibuprofen (ADVIL,MOTRIN) 600  MG tablet Take 1 tablet (600 mg total) by mouth every 8 (eight) hours as needed. Patient not taking: Reported on 10/02/2018 10/13/16   Tommi Rumps, PA-C  predniSONE (DELTASONE) 20 MG tablet Take one tab by mouth twice daily for 4 days, then one daily for 3 days. Take with food. Patient not taking: Reported on 10/02/2018 07/13/18   Lattie Haw, MD        VITAL SIGNS:  Blood pressure (!) 153/94, pulse 72, temperature 98 F (36.7 C), temperature source Oral, resp. rate 12, height 5\' 11"  (1.803 m), weight 99.8 kg, SpO2 98 %.  PHYSICAL EXAMINATION:  Physical Exam  GENERAL:  22 y.o.-year-old patient lying in bed with no acute distress.  EYES: Pupils equal, round, reactive to light and accommodation. No scleral icterus. Extraocular muscles intact.  HEENT: Head atraumatic, normocephalic. Oropharynx and nasopharynx clear. No oropharyngeal erythema, moist oral mucosa  NECK:  Supple, no jugular venous distention. No thyroid enlargement, no tenderness.  LUNGS: Normal breath sounds bilaterally, no wheezing, rales, rhonchi. No use of accessory muscles of respiration.  CARDIOVASCULAR: S1, S2 RRR. No murmurs, rubs, gallops, clicks.  ABDOMEN: Soft, nontender, nondistended. Bowel sounds present. No organomegaly or mass.  EXTREMITIES: No pedal edema, cyanosis, or clubbing. + 2 pedal & radial pulses b/l.   NEUROLOGIC: Cranial nerves II through XII are intact. No focal Motor or sensory deficits appreciated b/l PSYCHIATRIC: The patient is alert and oriented x 3. Good affect.  SKIN: No obvious rash, lesion, or ulcer.   LABORATORY PANEL:   CBC Recent Labs  Lab 10/02/18 0818 10/02/18 0825  WBC 8.4  --   HGB 17.3* 17.7*  HCT 49.0 52.0  PLT 233  --    ------------------------------------------------------------------------------------------------------------------  Chemistries  Recent Labs  Lab 10/02/18 0818 10/02/18 0825  NA 140 138  K 3.8 3.9  CL 101 101  CO2 27  --   GLUCOSE 105* 101*  BUN 24* 24*  CREATININE 2.83* 3.00*  CALCIUM 9.9  --   AST 19  --   ALT 19  --   ALKPHOS 119  --   BILITOT 1.0  --    ------------------------------------------------------------------------------------------------------------------  Cardiac Enzymes No results for input(s): TROPONINI in the last 168  hours. ------------------------------------------------------------------------------------------------------------------  RADIOLOGY:  No results found.   IMPRESSION AND PLAN:   22 year old male with past medical history of IBS who presents to the hospital due to abnormal blood work done at his primary care physician's office and he was noted to be in acute renal failure.  1.  Acute renal failure-suspected to be secondary to ATN from increasing use of NSAIDs and also receiving IV contrast for CT in the emergency room about a week ago. - We will hydrate the patient with IV fluids, follow BUN and creatinine urine output.  I will get a renal ultrasound, consult nephrology. - Renal dose meds, avoid nephrotoxins.  2.  Hypertension-patient's blood pressures have been somewhat elevated here in the ER.  I suspect this likely stress mediated/whitecoat hypertension.  Patient has no previous history of high blood pressure.  Hold on treating with blood pressure meds for now. -Follow hemodynamics.  3.  History of IBS-continue Bentyl.  4.  Back pain-improved.  Avoid NSAIDs due to ARF.  If needed will give oral tramadol Vicodin.    All the records are reviewed and case discussed with ED provider. Management plans discussed with the patient, family and they are in agreement.  CODE STATUS: Full code  TOTAL TIME TAKING  CARE OF THIS PATIENT: 45 minutes.    Houston SirenSAINANI,VIVEK J M.D on 10/02/2018 at 12:06 PM  Between 7am to 6pm - Pager - [phone]  After 6pm go to [email or web address] - password [email or web address]  [place] [place] [facility]  Office  [phone]  CC: Primary care physician; Emogene MorganAycock, Ngwe A, MD

## 2018-10-02 NOTE — ED Notes (Signed)
Admitting MD at bedside at this time.

## 2018-10-02 NOTE — ED Notes (Signed)
Amditting doctor in room talking with patient at this time.

## 2018-10-02 NOTE — ED Triage Notes (Signed)
Arrives for evaluation of "abnormal kidney bloodwork".  States was seen through ED recently and kidney levels were elevated.  Followed up with Phineas Realharles Drew yesterday and had blood work done, was called by MD there and told to follow up with ED for abnormal kidney levels.  Patient denies complaint.

## 2018-10-02 NOTE — ED Notes (Signed)
Pt states he was sent from DR for abnormal kidney function labs. Pt is in no distress at this time.

## 2018-10-02 NOTE — ED Provider Notes (Signed)
[facility] Emergency Department Provider Note   ____________________________________________   First MD Initiated Contact with Patient 10/02/18 [phone]     (approximate)  I have reviewed the triage vital signs and the nursing notes.   HISTORY  Chief Complaint abnormal labs    HPI Fernando Wolfe is a 22 y.o. male patient had back pain on the 13th took some Motrin but then switched to the oxycodone he got he also took a little bit of Pepto-Bismol he said about a half of a bottle between the 13th and today.  He took that because the oxycodone made him nauseated.   History reviewed. No pertinent past medical history.  Patient Active Problem List   Diagnosis Date Noted  . Acute renal failure (ARF) (HCC) 10/02/2018    History reviewed. No pertinent surgical history.  Prior to Admission medications   Medication Sig Start Date End Date Taking? Authorizing Provider  cyclobenzaprine (FLEXERIL) 5 MG tablet Take 1 tablet (5 mg total) by mouth 3 (three) times daily as needed for muscle spasms. 09/25/18  Yes Willy Eddyobinson, Patrick, MD  dicyclomine (BENTYL) 20 MG tablet Take 20 mg by mouth every 4 (four) hours while awake.   Yes [provider]  HYDROcodone-acetaminophen (NORCO) 5-325 MG tablet Take 1 tablet by mouth every 4 (four) hours as needed for moderate pain. 09/25/18  Yes Willy Eddyobinson, Patrick, MD  ibuprofen (ADVIL,MOTRIN) 600 MG tablet Take 1 tablet (600 mg total) by mouth every 8 (eight) hours as needed. Patient not taking: Reported on 10/02/2018 10/13/16   Tommi RumpsSummers, Rhonda L, PA-C  predniSONE (DELTASONE) 20 MG tablet Take one tab by mouth twice daily for 4 days, then one daily for 3 days. Take with food. Patient not taking: Reported on 10/02/2018 07/13/18   Lattie HawBeese, Stephen A, MD    Allergies Patient has no known allergies.  No family history on file.  Social History Social History   Tobacco Use  . Smoking status: Never Smoker  . Smokeless  tobacco: Never Used  Substance Use Topics  . Alcohol use: Yes    Alcohol/week: 5.0 standard drinks    Types: 5 Cans of beer per week  . Drug use: No    Review of Systems  Constitutional: No fever/chills Eyes: No visual changes. ENT: No sore throat. Cardiovascular: Denies chest pain. Respiratory: Denies shortness of breath. Gastrointestinal: No abdominal pain.  No nausea, no vomiting.  No diarrhea.  No constipation. Genitourinary: Negative for dysuria. Musculoskeletal: Negative for back pain. Skin: Negative for rash. Neurological: Negative for headaches, focal weakness  ____________________________________________   PHYSICAL EXAM:  VITAL SIGNS: ED Triage Vitals  Enc Vitals Group     BP 10/02/18 0814 (!) 146/93     Pulse Rate 10/02/18 0814 88     Resp 10/02/18 0814 20     Temp 10/02/18 0814 98 F (36.7 C)     Temp Source 10/02/18 0814 Oral     SpO2 10/02/18 0814 100 %     Weight 10/02/18 0811 200 lb (90.7 kg)     Height 10/02/18 0811 5\' 11"  (1.803 m)     Head Circumference --      Peak Flow --      Pain Score 10/02/18 0811 0     Pain Loc --      Pain Edu? --      Excl. in GC? --    Constitutional: Alert and oriented. Well appearing and in no acute distress. Eyes: Conjunctivae are normal.  Head: Atraumatic.  Nose: No congestion/rhinnorhea. Mouth/Throat: Mucous membranes are moist.  Oropharynx non-erythematous. Neck: No stridor.   Cardiovascular: Normal rate, regular rhythm. Grossly normal heart sounds.  Good peripheral circulation. Respiratory: Normal respiratory effort.  No retractions. Lungs CTAB. Gastrointestinal: Soft and nontender. No distention. No abdominal bruits. No CVA tenderness. Musculoskeletal: No lower extremity tenderness nor edema. Neurologic:  Normal speech and language. No gross focal neurologic deficits are appreciated. No gait instability. Skin:  Skin is warm, dry and intact. No rash noted. Psychiatric: Mood and affect are normal. Speech and  behavior are normal.  ____________________________________________   LABS (all labs ordered are listed, but only abnormal results are displayed)  Labs Reviewed  COMPREHENSIVE METABOLIC PANEL - Abnormal; Notable for the following components:      Result Value   Glucose, Bld 105 (*)    BUN 24 (*)    Creatinine, Ser 2.83 (*)    Total Protein 9.0 (*)    GFR calc non Af Amer 30 (*)    GFR calc Af Amer 35 (*)    All other components within normal limits  CBC WITH DIFFERENTIAL/PLATELET - Abnormal; Notable for the following components:   Hemoglobin 17.3 (*)    All other components within normal limits  URINALYSIS, COMPLETE (UACMP) WITH MICROSCOPIC - Abnormal; Notable for the following components:   Color, Urine STRAW (*)    APPearance CLEAR (*)    Specific Gravity, Urine 1.003 (*)    Hgb urine dipstick MODERATE (*)    Leukocytes, UA TRACE (*)    All other components within normal limits  POCT I-STAT, CHEM 8 - Abnormal; Notable for the following components:   BUN 24 (*)    Creatinine, Ser 3.00 (*)    Glucose, Bld 101 (*)    Hemoglobin 17.7 (*)    All other components within normal limits  BASIC METABOLIC PANEL  I-STAT CHEM 8, ED   ____________________________________________  EKG   ____________________________________________  RADIOLOGY  ED MD interpretation: Radiology reads ultrasound of the kidneys is normal  Official radiology report(s): US Renal  Result Date: 10/02/2018 CLINICAL DATA:  Initial evaluation for acute renal failure. EXAM: RENAL / URINARY TRACT ULTRASOUND COMPLETE COMPARISON:  None. FINDINGS: Right Kidney: Renal measurements: 12.3 x 6.7 x 6.1 cm = volume: 263.5 mL . Echogenicity within normal limits. No mass or hydronephrosis visualized. Left Kidney: Renal measurements: 13.8 x 7.3 x 6.6 cm = volume: 345.3 mL. Echogenicity within normal limits. No mass or hydronephrosis visualized. Bladder: Appears normal for degree of bladder distention. Bilateral ureteral  jets visualized. IMPRESSION: Normal renal ultrasound.  No hydronephrosis. Electronically Signed   By: Rise Mu M.D.   On: 10/02/2018 15:09    ____________________________________________   PROCEDURES  Procedure(s) performed:   Procedures  Critical Care performed:   ____________________________________________   INITIAL IMPRESSION / ASSESSMENT AND PLAN / ED COURSE Patient with acute kidney injury not sure if this is due to the NSAID use or not it does not seem like he is used many NSAIDs.  We will get him in the hospital and evaluate him further.         ____________________________________________   FINAL CLINICAL IMPRESSION(S) / ED DIAGNOSES  Final diagnoses:  AKI (acute kidney injury) [facility])     ED Discharge Orders    None       Note:  This document was prepared using Dragon voice recognition software and may include unintentional dictation errors.    Arnaldo Natal, MD 10/02/18 [phone]

## 2018-10-03 LAB — BASIC METABOLIC PANEL
Anion gap: 10 (ref 5–15)
Anion gap: 11 (ref 5–15)
BUN: 23 mg/dL — AB (ref 6–20)
BUN: 21 mg/dL — AB (ref 6–20)
CALCIUM: 9.2 mg/dL (ref 8.9–10.3)
CALCIUM: 9.3 mg/dL (ref 8.9–10.3)
CO2: 26 mmol/L (ref 22–32)
CO2: 26 mmol/L (ref 22–32)
CREATININE: 2.13 mg/dL — AB (ref 0.61–1.24)
CREATININE: 1.98 mg/dL — AB (ref 0.61–1.24)
Chloride: 105 mmol/L (ref 98–111)
Chloride: 105 mmol/L (ref 98–111)
GFR calc non Af Amer: 42 mL/min — ABNORMAL LOW (ref >60)
GFR calc non Af Amer: 46 mL/min — ABNORMAL LOW (ref >60)
GFR, EST AFRICAN AMERICAN: 49 mL/min — AB (ref >60)
GFR, EST AFRICAN AMERICAN: 54 mL/min — AB (ref >60)
GLUCOSE: 87 mg/dL (ref 70–99)
Glucose, Bld: 101 mg/dL — ABNORMAL HIGH (ref 70–99)
Potassium: 4.3 mmol/L (ref 3.5–5.1)
Potassium: 4 mmol/L (ref 3.5–5.1)
Sodium: 141 mmol/L (ref 135–145)
Sodium: 142 mmol/L (ref 135–145)

## 2018-10-03 NOTE — Progress Notes (Signed)
Pt discharged via wheelchair by nursing to the visitor's entrance 

## 2018-10-03 NOTE — Progress Notes (Signed)
[facility]    Fernando Wolfe was admitted to the Hospital on 10/02/2018 and Discharged  10/03/2018 and should be excused from work/school   for 5 days starting 10/02/2018 , may return to work/school without any restrictions.  Call Fernando LiasVivek Greg Eckrich MD, [facility]  [phone] with questions.  Fernando SirenSAINANI,Fernando Wolfe M.D on 10/03/2018,at 2:11 PM

## 2018-10-03 NOTE — Discharge Summary (Signed)
[facility] - [place] at [facility]   PATIENT NAME: Fernando ReddenHumberto Wolfe    MR#:  [id number]  DATE OF BIRTH:  07/28/1996  DATE OF ADMISSION:  10/02/2018 ADMITTING PHYSICIAN: Fernando Wolfe , Wolfe  DATE OF DISCHARGE: 10/03/2018  PRIMARY CARE PHYSICIAN: Fernando Wolfe, Fernando Wolfe    ADMISSION DIAGNOSIS:  AKI (acute kidney injury) (HCC) [N17.9]  DISCHARGE DIAGNOSIS:  Active Problems:   Acute renal failure (ARF) (HCC)   SECONDARY DIAGNOSIS:  History reviewed. No pertinent past medical history.  HOSPITAL COURSE:   22 year old male with past medical history of IBS who presents to the hospital due to abnormal blood work done at his primary care physician's office and he was noted to be in acute renal failure.  1.  Acute renal failure-suspected to be secondary to ATN from increasing use of NSAIDs and also receiving IV contrast for CT in the emergency room about a week ago. -Patient had a renal ultrasound done which showed no evidence of obstruction or hydronephrosis.  He was aggressively hydrated with IV fluids and his creatinine has improved and trended down.  Patient was seen by nephrology and they agreed with this management.  Since patient's creatinine is trending down he is being discharged home with outpatient follow-up with nephrology next week.  He was strongly advised to avoid NSAIDs.  2.  Hypertension- this was stress mediated and has now improved.  No need for acute antihypertensives.  3.  History of IBS- pt. Will continue Bentyl.  4.  Back pain-improved.  Avoid NSAIDs due to ARF.    DISCHARGE CONDITIONS:   Stable  CONSULTS OBTAINED:    DRUG ALLERGIES:   Allergies  Allergen Reactions  . Contrast Media [Iodinated Diagnostic Agents] Other (See Comments)    Acute kidney failure    DISCHARGE MEDICATIONS:   Allergies as of 10/03/2018      Reactions   Contrast Media [iodinated Diagnostic Agents] Other (See Comments)   Acute kidney failure       Medication List    STOP taking these medications   ibuprofen 600 MG tablet Commonly known as:  ADVIL,MOTRIN   predniSONE 20 MG tablet Commonly known as:  DELTASONE     TAKE these medications   cyclobenzaprine 5 MG tablet Commonly known as:  FLEXERIL Take 1 tablet (5 mg total) by mouth 3 (three) times daily as needed for muscle spasms.   dicyclomine 20 MG tablet Commonly known as:  BENTYL Take 20 mg by mouth every 4 (four) hours while awake.   HYDROcodone-acetaminophen 5-325 MG tablet Commonly known as:  NORCO/VICODIN Take 1 tablet by mouth every 4 (four) hours as needed for moderate pain.         DISCHARGE INSTRUCTIONS:   DIET:  Regular diet  DISCHARGE CONDITION:  Stable  ACTIVITY:  Activity as tolerated  OXYGEN:  Home Oxygen: No.   Oxygen Delivery: room air  DISCHARGE LOCATION:  home   If you experience worsening of your admission symptoms, develop shortness of breath, life threatening emergency, suicidal or homicidal thoughts you must seek medical attention immediately by calling 911 or calling your Wolfe immediately  if symptoms less severe.  You Must read complete instructions/literature along with all the possible adverse reactions/side effects for all the Medicines you take and that have been prescribed to you. Take any new Medicines after you have completely understood and accpet all the possible adverse reactions/side effects.   Please note  You were cared for by a hospitalist during your hospital stay.  If you have any questions about your discharge medications or the care you received while you were in the hospital after you are discharged, you can call the unit and asked to speak with the hospitalist on call if the hospitalist that took care of you is not available. Once you are discharged, your primary care physician will handle any further medical issues. Please note that NO REFILLS for any discharge medications will be authorized once you are  discharged, as it is imperative that you return to your primary care physician (or establish a relationship with a primary care physician if you do not have one) for your aftercare needs so that they can reassess your need for medications and monitor your lab values.     Today   No acute events overnight, creatinine has trended down with IV fluids.  Blood pressure stable.  Asymptomatic.  Will discharge home today.  VITAL SIGNS:  Blood pressure 137/88, pulse (!) 55, temperature 98.6 F (37 C), temperature source Oral, resp. rate 16, height 5\' 11"  (1.803 m), weight 99.8 kg, SpO2 97 %.  I/O:    Intake/Output Summary (Last 24 hours) at 10/03/2018 1524 Last data filed at 10/03/2018 1354 Gross per 24 hour  Intake 3108.52 ml  Output 750 ml  Net 2358.52 ml    PHYSICAL EXAMINATION:  GENERAL:  22 y.o.-year-old patient lying in the bed with no acute distress.  EYES: Pupils equal, round, reactive to light and accommodation. No scleral icterus. Extraocular muscles intact.  HEENT: Head atraumatic, normocephalic. Oropharynx and nasopharynx clear.  NECK:  Supple, no jugular venous distention. No thyroid enlargement, no tenderness.  LUNGS: Normal breath sounds bilaterally, no wheezing, rales,rhonchi. No use of accessory muscles of respiration.  CARDIOVASCULAR: S1, S2 normal. No murmurs, rubs, or gallops.  ABDOMEN: Soft, non-tender, non-distended. Bowel sounds present. No organomegaly or mass.  EXTREMITIES: No pedal edema, cyanosis, or clubbing.  NEUROLOGIC: Cranial nerves II through XII are intact. No focal motor or sensory defecits b/l.  PSYCHIATRIC: The patient is alert and oriented x 3.  SKIN: No obvious rash, lesion, or ulcer.   DATA REVIEW:   CBC Recent Labs  Lab 10/02/18 0818 10/02/18 0825  WBC 8.4  --   HGB 17.3* 17.7*  HCT 49.0 52.0  PLT 233  --     Chemistries  Recent Labs  Lab 10/02/18 0818  10/03/18 1253  NA 140   < > 142  K 3.8   < > 4.0  CL 101   < > 105  CO2 27    < > 26  GLUCOSE 105*   < > 87  BUN 24*   < > 21*  CREATININE 2.83*   < > 1.98*  CALCIUM 9.9   < > 9.3  AST 19  --   --   ALT 19  --   --   ALKPHOS 119  --   --   BILITOT 1.0  --   --    < > = values in this interval not displayed.    Cardiac Enzymes No results for input(s): TROPONINI in the last 168 hours.    RADIOLOGY:  US Renal  Result Date: 10/02/2018 CLINICAL DATA:  Initial evaluation for acute renal failure. EXAM: RENAL / URINARY TRACT ULTRASOUND COMPLETE COMPARISON:  None. FINDINGS: Right Kidney: Renal measurements: 12.3 x 6.7 x 6.1 cm = volume: 263.5 mL . Echogenicity within normal limits. No mass or hydronephrosis visualized. Left Kidney: Renal measurements: 13.8 x 7.3 x 6.6 cm =  volume: 345.3 mL. Echogenicity within normal limits. No mass or hydronephrosis visualized. Bladder: Appears normal for degree of bladder distention. Bilateral ureteral jets visualized. IMPRESSION: Normal renal ultrasound.  No hydronephrosis. Electronically Signed   By: Rise Mu M.D.   On: 10/02/2018 15:09      Management plans discussed with the patient, family and they are in agreement.  CODE STATUS:     Code Status Orders  (From admission, onward)         Start     Ordered   10/02/18 1333  Full code  Continuous     10/02/18 1333        Code Status History    This patient has a current code status but no historical code status.      TOTAL TIME TAKING CARE OF THIS PATIENT: 40 minutes.    Fernando Siren M.D on 10/03/2018 at 3:24 PM  Between 7am to 6pm - Pager - [phone]  After 6pm go to [email or web address] - Scientist, research (life sciences) [place] [facility]  Office  [phone]  CC: Primary care physician; Fernando Morgan, Wolfe

## 2018-10-03 NOTE — Progress Notes (Signed)
MD order received to [facility] pt [facility] today [facility]; verbally reviewed AVS with pt, no new Rxs; gave pt work note previously documented [facility]; no questions voiced at this time

## 2018-10-03 NOTE — Consult Note (Signed)
[facility]  CONSULT NOTE    Date: 10/03/2018                  Patient Name:  Fernando Wolfe  MRN: [id number]  DOB: 28-Apr-1996  Age / Sex: 22 y.o., male         PCP: Emogene Morgan, MD                 Service Requesting Consult: Dr. Cherlynn Kaiser                 Reason for Consult: Acute renal failure            History of Present Illness: Fernando Wolfe is a 22 y.o. Hispanic male with history IBS who was admitted to [facility] on 10/02/2018 for AKI (acute kidney injury) [facility]) [N17.9]  Patient had CT with IV contrast for abdominal pain on 11/13. He was then taking ibuprofen for several days for abdominal pain and back pain. Denies any fevers, chills, diarrhea, nausea/vomiting or constipation. Appetite is good. Patient placed on IV fluids overnight. No obstruction on ultrasound. Urinalysis negative for blood and proteinuria.   Creatinine on admission of 2.83-> 3 -> 2.13.   Wife at bedside who assists with history taking. Denies history of kidney failure or insufficiency.    Medications: Outpatient medications: Medications Prior to Admission  Medication Sig Dispense Refill Last Dose  . cyclobenzaprine (FLEXERIL) 5 MG tablet Take 1 tablet (5 mg total) by mouth 3 (three) times daily as needed for muscle spasms. 12 tablet 0 Past Week at Unknown time  . dicyclomine (BENTYL) 20 MG tablet Take 20 mg by mouth every 4 (four) hours while awake.   10/02/2018 at 0700  . HYDROcodone-acetaminophen (NORCO) 5-325 MG tablet Take 1 tablet by mouth every 4 (four) hours as needed for moderate pain. 6 tablet 0 10/01/2018 at 1200  . ibuprofen (ADVIL,MOTRIN) 600 MG tablet Take 1 tablet (600 mg total) by mouth every 8 (eight) hours as needed. (Patient not taking: Reported on 10/02/2018) 30 tablet 0 Not Taking at Unknown time  . predniSONE (DELTASONE) 20 MG tablet Take one tab by mouth twice daily for 4 days, then one daily for 3 days. Take with food. (Patient not taking: Reported on  10/02/2018) 11 tablet 0 Not Taking at Unknown time    Current medications: Current Facility-Administered Medications  Medication Dose Route Frequency Provider Last Rate Last Dose  . 0.9 %  sodium chloride infusion   Intravenous Continuous Houston Siren, MD 125 mL/hr at 10/03/18 0630    . acetaminophen (TYLENOL) tablet 650 mg  650 mg Oral Q6H PRN Houston Siren, MD       Or  . acetaminophen (TYLENOL) suppository 650 mg  650 mg Rectal Q6H PRN Houston Siren, MD      . dicyclomine (BENTYL) tablet 20 mg  20 mg Oral Q4H while awake Houston Siren, MD   20 mg at 10/03/18 0928  . heparin injection 5,000 Units  5,000 Units Subcutaneous Q8H Houston Siren, MD   Stopped at 10/02/18 1403  . ondansetron (ZOFRAN) tablet 4 mg  4 mg Oral Q6H PRN Houston Siren, MD       Or  . ondansetron (ZOFRAN) injection 4 mg  4 mg Intravenous Q6H PRN Houston Siren, MD          Allergies: Allergies  Allergen Reactions  . Contrast Media [Iodinated Diagnostic Agents] Other (See Comments)    Acute  kidney failure      Past Medical History: History reviewed. No pertinent past medical history.   Past Surgical History: History reviewed. No pertinent surgical history.   Family History: No family history on file.   Social History: Social History   Socioeconomic History  . Marital status: Single    Spouse name: Not on file  . Number of children: Not on file  . Years of education: Not on file  . Highest education level: Not on file  Occupational History  . Not on file  Social Needs  . Financial resource strain: Not hard at all  . Food insecurity:    Worry: Never true    Inability: Never true  . Transportation needs:    Medical: No    Non-medical: No  Tobacco Use  . Smoking status: Never Smoker  . Smokeless tobacco: Never Used  Substance and Sexual Activity  . Alcohol use: Yes    Alcohol/week: 5.0 standard drinks    Types: 5 Cans of beer per week  . Drug use: No  . Sexual  activity: Yes  Lifestyle  . Physical activity:    Days per week: 3 days    Minutes per session: 20 min  . Stress: Only a little  Relationships  . Social connections:    Talks on phone: More than three times a week    Gets together: More than three times a week    Attends religious service: Not on file    Active member of club or organization: Not on file    Attends meetings of clubs or organizations: Not on file    Relationship status: Not on file  . Intimate partner violence:    Fear of current or ex partner: No    Emotionally abused: No    Physically abused: No    Forced sexual activity: No  Other Topics Concern  . Not on file  Social History Narrative  . Not on file     Review of Systems: Review of Systems  Constitutional: Negative.  Negative for chills, diaphoresis, fever, malaise/fatigue and weight loss.  HENT: Negative.  Negative for congestion, ear discharge, ear pain, hearing loss, nosebleeds, sinus pain, sore throat and tinnitus.   Eyes: Negative.  Negative for blurred vision, double vision, photophobia, pain, discharge and redness.  Respiratory: Negative.  Negative for cough, hemoptysis, sputum production, shortness of breath, wheezing and stridor.   Cardiovascular: Negative.  Negative for chest pain, palpitations, orthopnea, claudication, leg swelling and PND.  Gastrointestinal: Positive for abdominal pain. Negative for blood in stool, constipation, diarrhea, heartburn, melena, nausea and vomiting.  Skin: Negative.  Negative for itching and rash.    Vital Signs: Blood pressure 110/68, pulse (!) 58, temperature 98.4 F (36.9 C), temperature source Oral, resp. rate 16, height 5\' 11"  (1.803 m), weight 99.8 kg, SpO2 99 %.  Weight trends: Filed Weights   10/02/18 0811 10/02/18 0815  Weight: 90.7 kg 99.8 kg    Physical Exam: General: NAD,   Head: Normocephalic, atraumatic. Moist oral mucosal membranes  Eyes: Anicteric, PERRL  Neck: Supple, trachea midline   Lungs:  Clear to auscultation  Heart: Regular rate and rhythm  Abdomen:  Soft, nontender,   Extremities:  no peripheral edema.  Neurologic: Nonfocal, moving all four extremities  Skin: No lesions         Lab results: Basic Metabolic Panel: Recent Labs  Lab 10/02/18 0818 10/02/18 0825 10/03/18 0617  NA 140 138 141  K 3.8 3.9 4.3  CL 101 101 105  CO2 27  --  26  GLUCOSE 105* 101* 101*  BUN 24* 24* 23*  CREATININE 2.83* 3.00* 2.13*  CALCIUM 9.9  --  9.2    Liver Function Tests: Recent Labs  Lab 10/02/18 0818  AST 19  ALT 19  ALKPHOS 119  BILITOT 1.0  PROT 9.0*  ALBUMIN 4.9   No results for input(s): LIPASE, AMYLASE in the last 168 hours. No results for input(s): AMMONIA in the last 168 hours.  CBC: Recent Labs  Lab 10/02/18 0818 10/02/18 0825  WBC 8.4  --   NEUTROABS 5.1  --   HGB 17.3* 17.7*  HCT 49.0 52.0  MCV 84.3  --   PLT 233  --     Cardiac Enzymes: No results for input(s): CKTOTAL, CKMB, CKMBINDEX, TROPONINI in the last 168 hours.  BNP: Invalid input(s): POCBNP  CBG: No results for input(s): GLUCAP in the last 168 hours.  Microbiology: Results for orders placed or performed during the hospital encounter of 07/13/18  C. trachomatis/N. gonorrhoeae RNA (GC/Chlamydia)     Status: None   Collection Time: 07/13/18  1:05 PM  Result Value Ref Range Status   C. trachomatis RNA, TMA NOT DETECTED NOT DETECT Final   N. gonorrhoeae RNA, TMA NOT DETECTED NOT DETECT Final    Comment: This test was performed using the APTIMA COMBO2 Assay ([employer] . The analytical performance characteristics of this  assay, when used to test SurePath specimens have been determined by [employer]. .   Urine culture     Status: None   Collection Time: 07/13/18  1:56 PM  Result Value Ref Range Status   MICRO NUMBER: [id number]  Final   SPECIMEN QUALITY: ADEQUATE  Final   Sample Source URINE  Final   STATUS: FINAL  Final   Result: No Growth  Final     Coagulation Studies: No results for input(s): LABPROT, INR in the last 72 hours.  Urinalysis: Recent Labs    10/02/18 0954  COLORURINE STRAW*  LABSPEC 1.003*  PHURINE 6.0  GLUCOSEU NEGATIVE  HGBUR MODERATE*  BILIRUBINUR NEGATIVE  KETONESUR NEGATIVE  PROTEINUR NEGATIVE  NITRITE NEGATIVE  LEUKOCYTESUR TRACE*      Imaging: [place] Renal  Result Date: 10/02/2018 CLINICAL DATA:  Initial evaluation for acute renal failure. EXAM: RENAL / URINARY TRACT ULTRASOUND COMPLETE COMPARISON:  None. FINDINGS: Right Kidney: Renal measurements: 12.3 x 6.7 x 6.1 cm = volume: 263.5 mL . Echogenicity within normal limits. No mass or hydronephrosis visualized. Left Kidney: Renal measurements: 13.8 x 7.3 x 6.6 cm = volume: 345.3 mL. Echogenicity within normal limits. No mass or hydronephrosis visualized. Bladder: Appears normal for degree of bladder distention. Bilateral ureteral jets visualized. IMPRESSION: Normal renal ultrasound.  No hydronephrosis. Electronically Signed   By: Rise MuBenjamin  McClintock M.D.   On: 10/02/2018 15:09      Assessment & Plan: Fernando Wolfe is a 22 y.o. Hispanic male with history of IBS, who [facility] for acute renal failure  1. Acute renal failure: secondary to IV contrast and ibuprofen. Improving with IV fluids.  Baseline creatinine of 0.91 on 6/30.  - Continue IV fluids - Patient to avoid nonsteroidal anti-inflammatory agents.  - Make IV contrast an allergy.    LOS: 1 Genever Hentges 11/21/20199:57 AM

## 2018-10-28 ENCOUNTER — Ambulatory Visit: Payer: Self-pay | Admitting: Physician Assistant

## 2019-05-31 IMAGING — CT CT ABD-PELV W/ CM
2 of 4 series · 16 of 46 positions shown, 18 images · IV contrast (APPLIED)
Comparison: Prior CT scan of the abdomen and pelvis 04/27/2017

CLINICAL DATA: 22-year-old male with left lower back and abdominal
pain

EXAM:
CT ABDOMEN AND PELVIS WITH CONTRAST
TECHNIQUE: Multidetector CT imaging of the abdomen and pelvis was performed
using the standard protocol following bolus administration of
intravenous contrast.
CONTRAST:  100mL [id number] IOPAMIDOL (AN8OGZ-U77) INJECTION 61%

[Series 2: routine abd/pel with · axial · 0.77mm/px · z∈[-493,-38]mm · 13 of 99 slices shown, 15 images]
[im 4/99  soft-tissue]
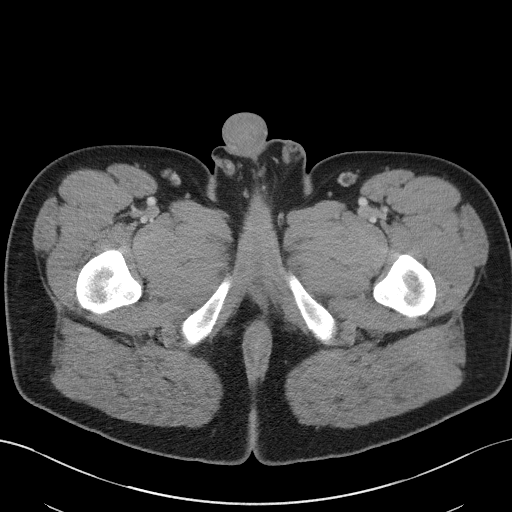
[im 4/99  bone]
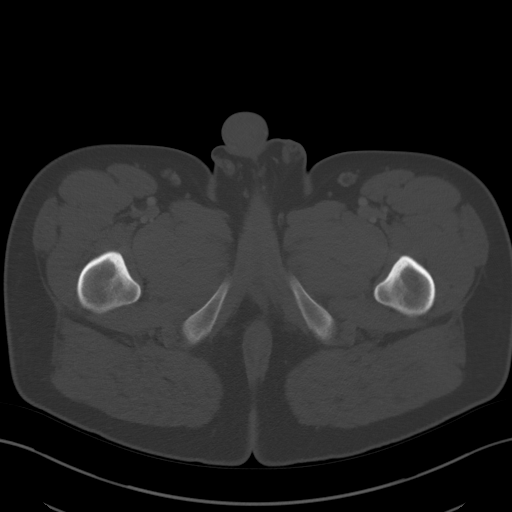
[im 12/99  soft-tissue]
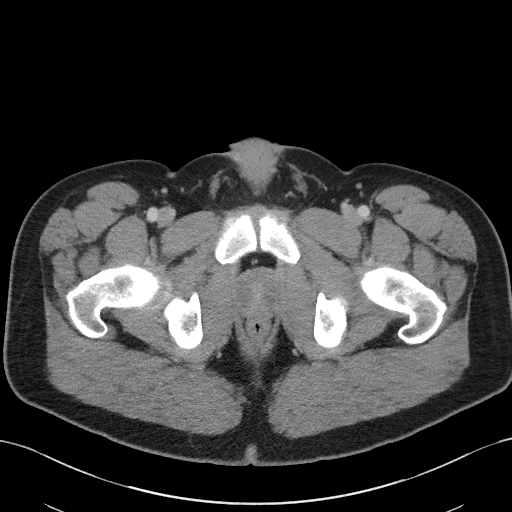
[im 20/99  soft-tissue]
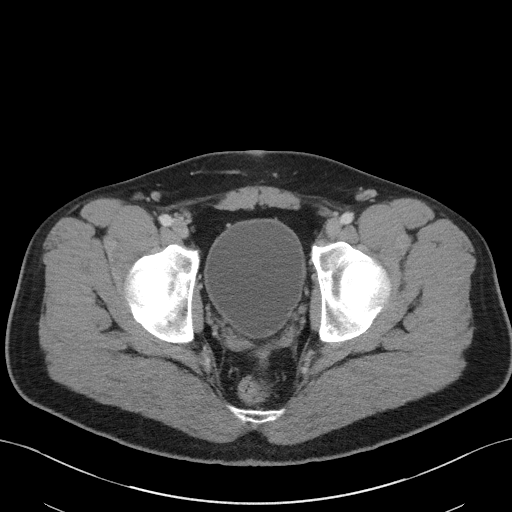
[im 28/99  soft-tissue]
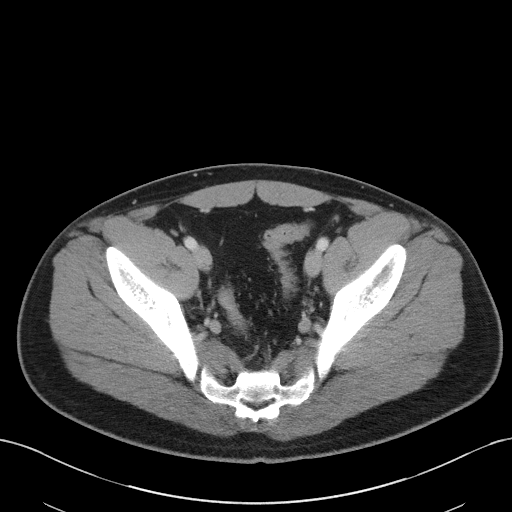
[im 36/99  soft-tissue]
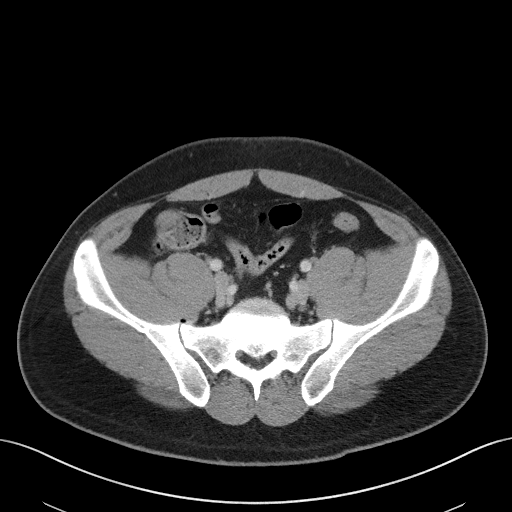
[im 44/99  soft-tissue]
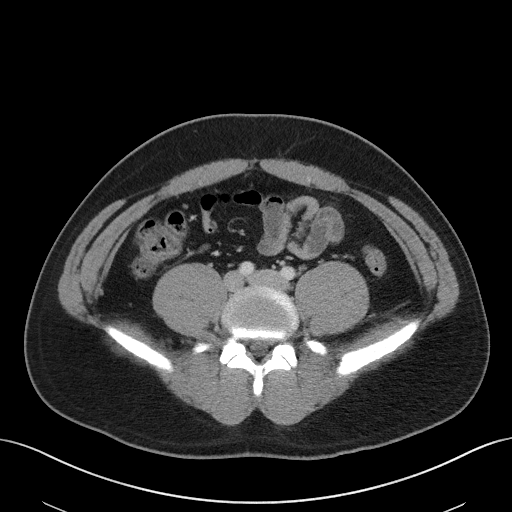
[im 51/99  soft-tissue]
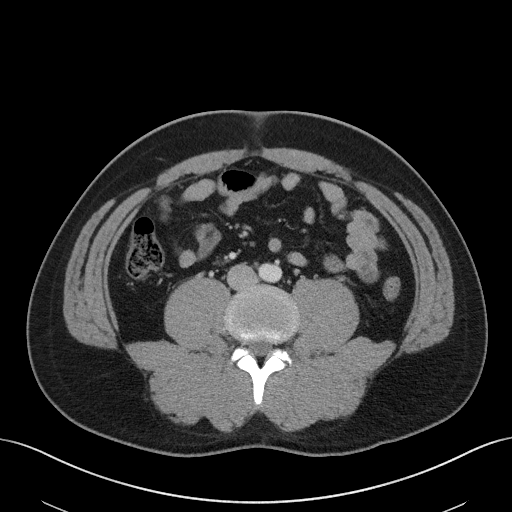
[im 55/99  soft-tissue]
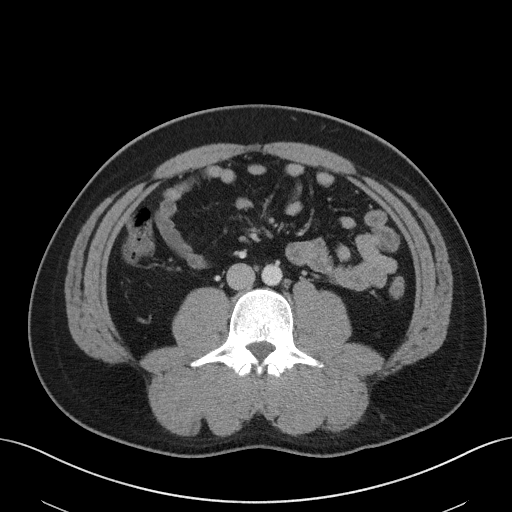
[im 63/99  soft-tissue]
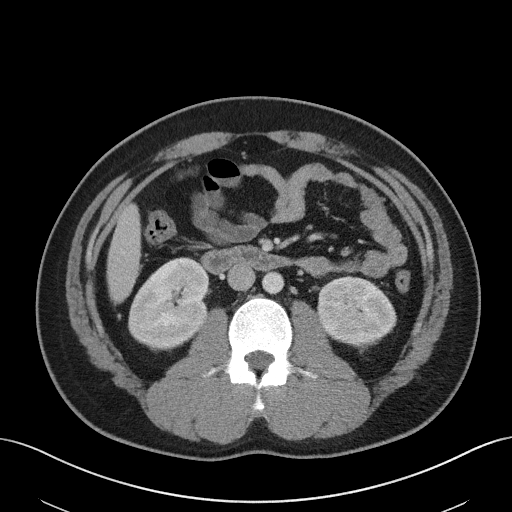
[im 63/99  bone]
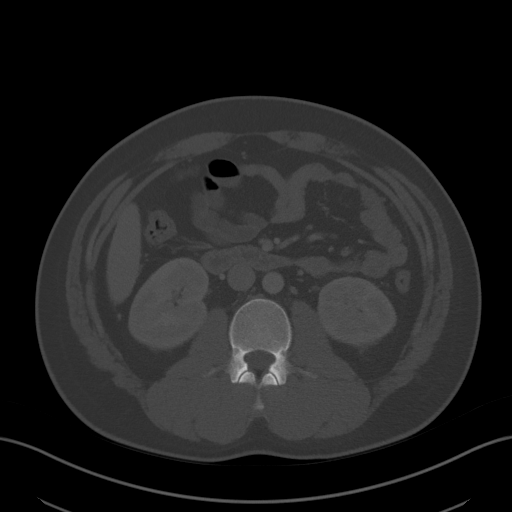
[im 71/99  soft-tissue]
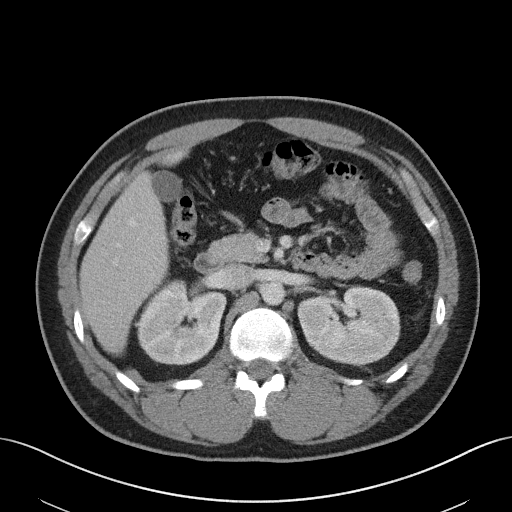
[im 79/99  soft-tissue]
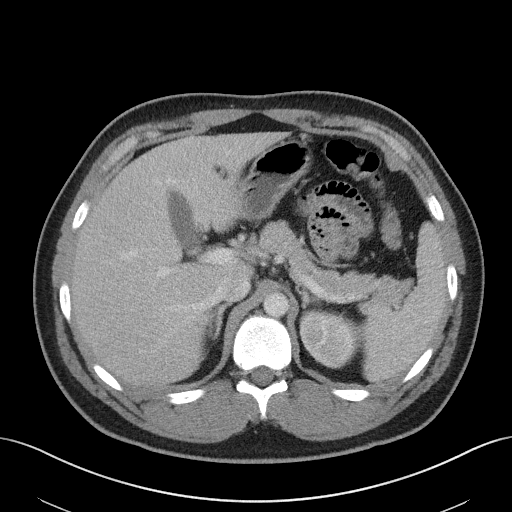
[im 87/99  soft-tissue]
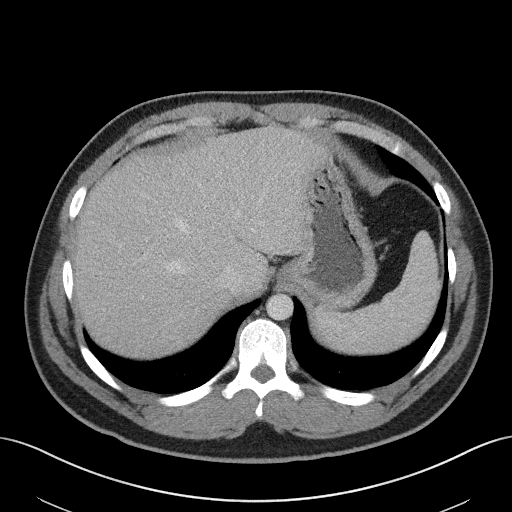
[im 95/99  soft-tissue]
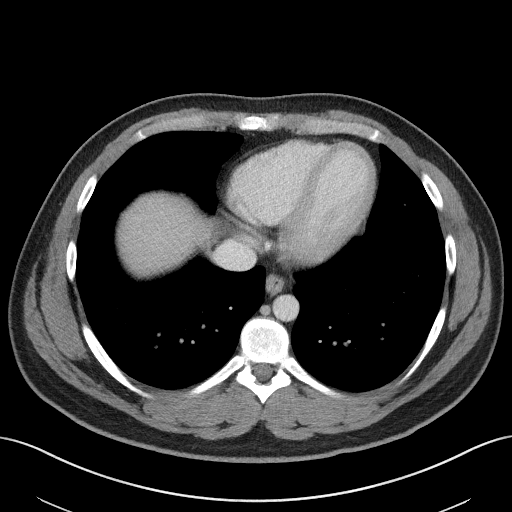

[Series 5: coronal st · coronal · 0.72mm/px · 3 of 98 slices shown]
[im 33/98  soft-tissue]
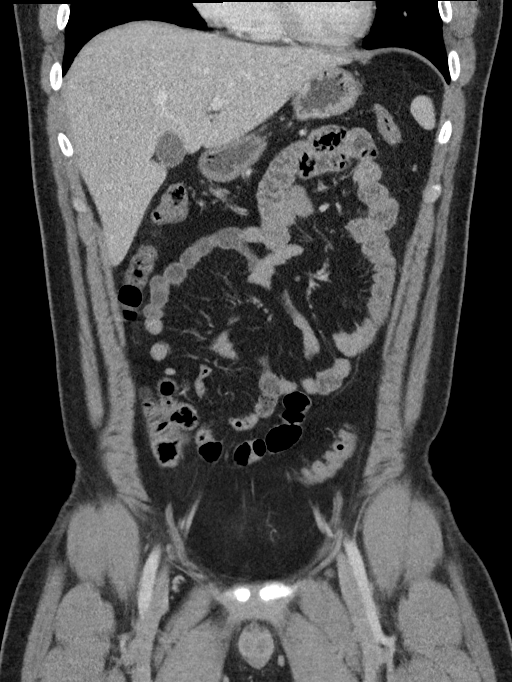
[im 44/98  soft-tissue]
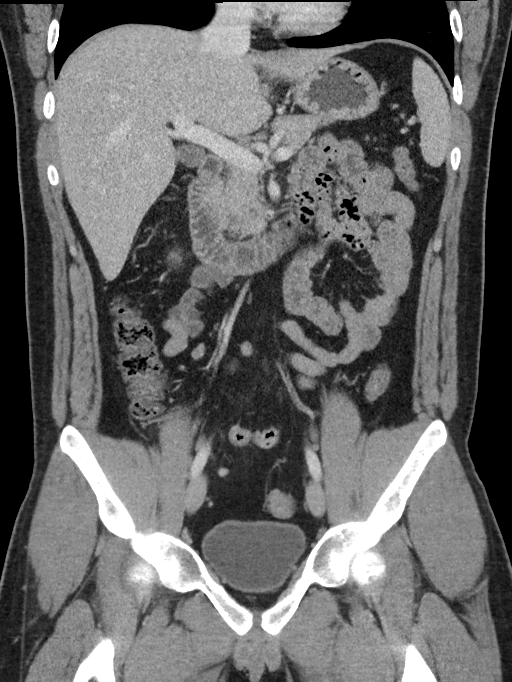
[im 54/98  soft-tissue]
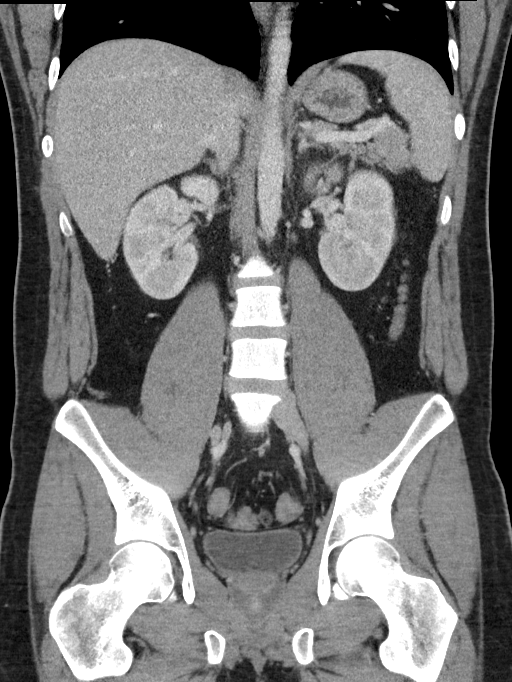

[16 of 46 positions shown; findings below may reference images not displayed]

FINDINGS: Lower chest: The lung bases are clear. Visualized cardiac structures
are within normal limits for size. No pericardial effusion.
Unremarkable visualized distal thoracic esophagus.

Hepatobiliary: Normal hepatic contour and morphology. No discrete
hepatic lesions. Normal appearance of the gallbladder. No intra or
extrahepatic biliary ductal dilatation.

Pancreas: Unremarkable. No pancreatic ductal dilatation or
surrounding inflammatory changes.

Spleen: Normal in size without focal abnormality.

Adrenals/Urinary Tract: Adrenal glands are unremarkable. Kidneys are
normal, without renal calculi, focal lesion, or hydronephrosis.
Bladder is unremarkable.

Stomach/Bowel: No evidence of obstruction or focal bowel wall
thickening. Normal appendix in the right lower quadrant. The
terminal ileum is unremarkable.

Vascular/Lymphatic: No significant vascular findings are present. No
enlarged abdominal or pelvic lymph nodes.

Reproductive: Prostate is unremarkable.

Other: No abdominal wall hernia or abnormality. No abdominopelvic
ascites.

Musculoskeletal: No acute fracture or aggressive appearing lytic or
blastic osseous lesion.
IMPRESSION: Normal CT scan of the abdomen and pelvis.

## 2019-06-07 IMAGING — US US RENAL
1 series · 14 of 25 positions shown · non-contrast
Comparison: None.

CLINICAL DATA: Initial evaluation for acute renal failure.

EXAM:
RENAL / URINARY TRACT ULTRASOUND COMPLETE

[Series 1: us renal · 0.23mm/px · 14 of 56 slices shown]
[im 1/56]
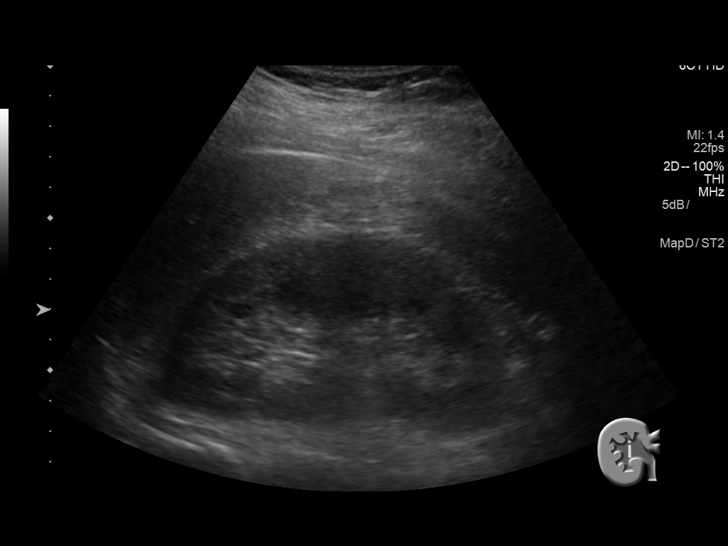
[im 5/56]
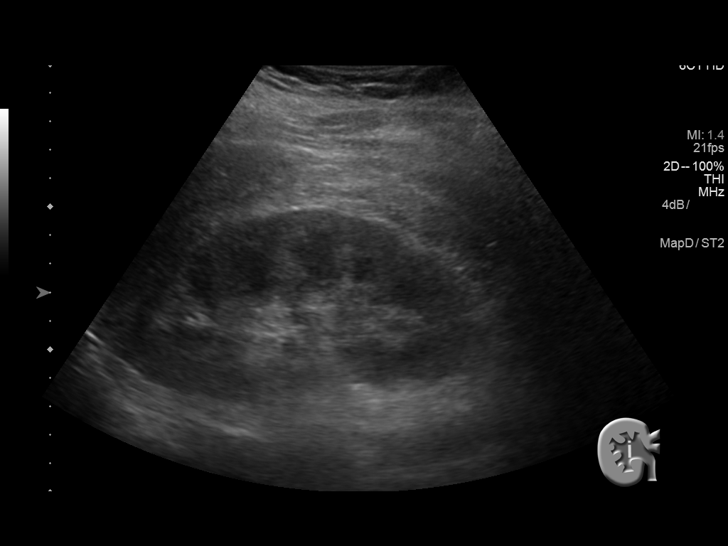
[im 10/56]
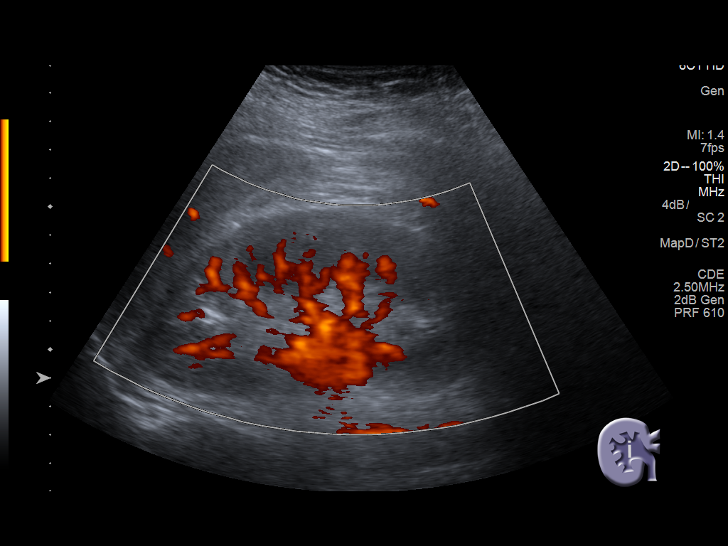
[im 14/56]
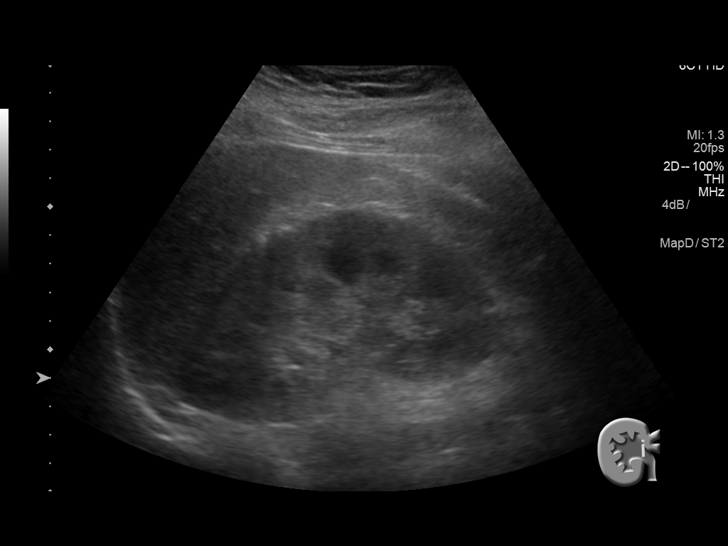
[im 19/56]
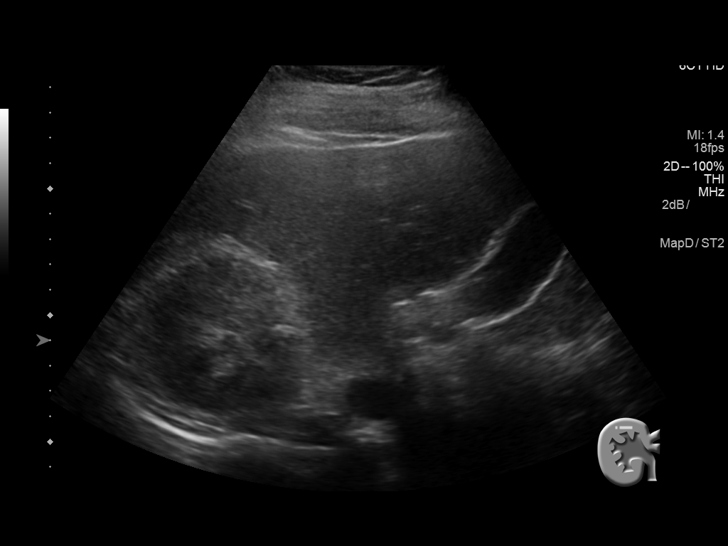
[im 21/56]
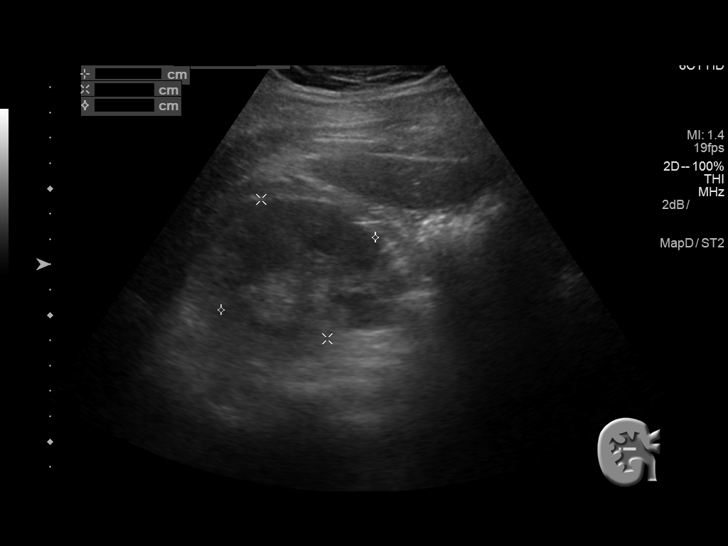
[im 26/56]
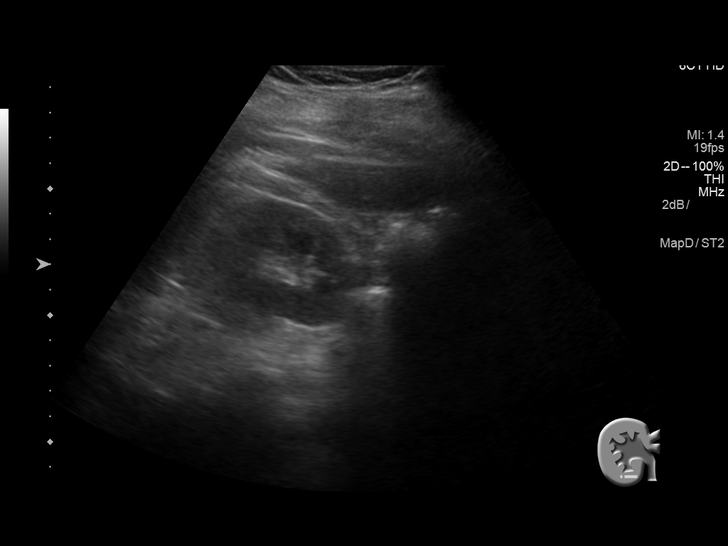
[im 30/56]
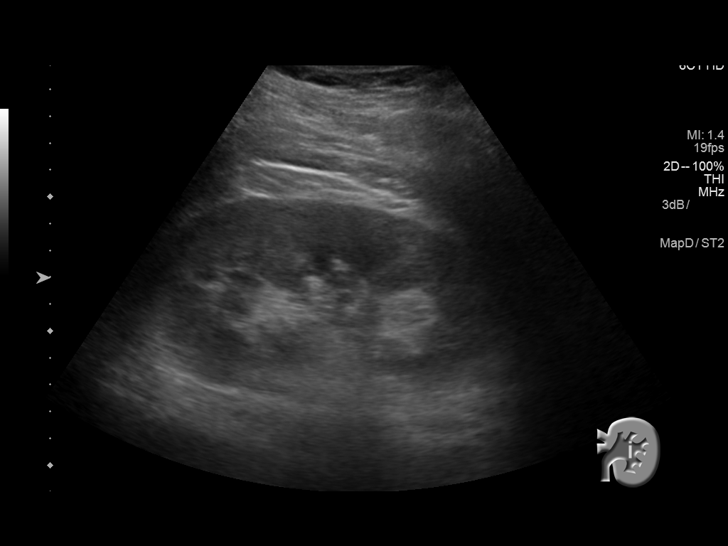
[im 35/56]
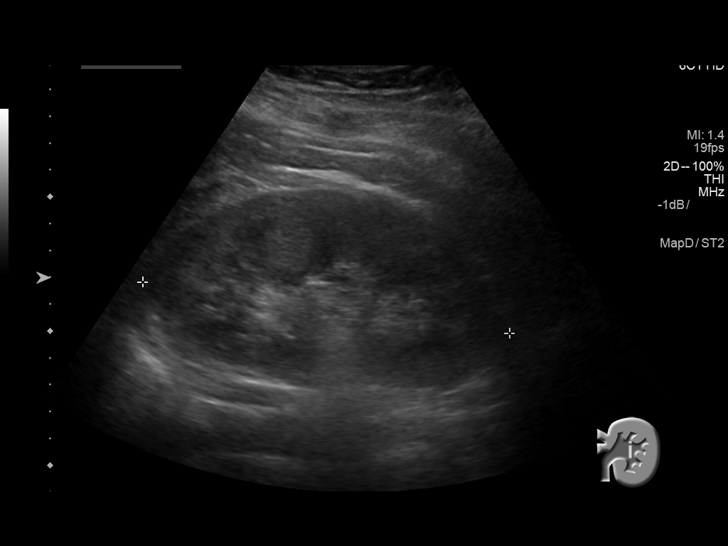
[im 37/56]
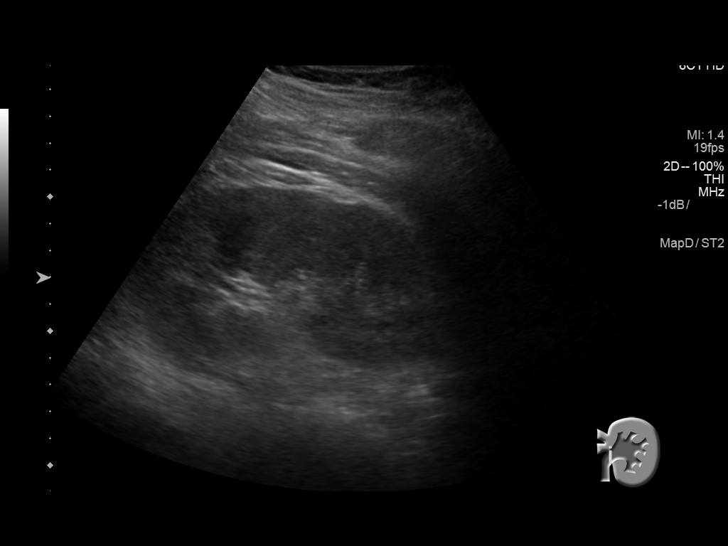
[im 42/56]
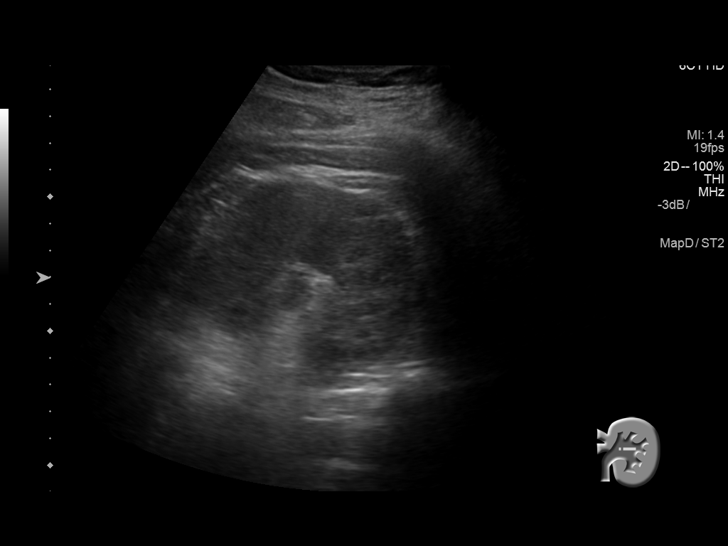
[im 46/56]
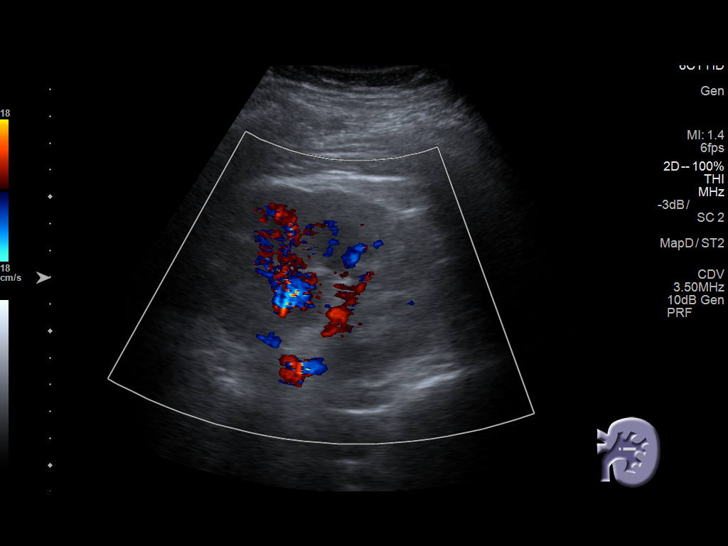
[im 51/56]
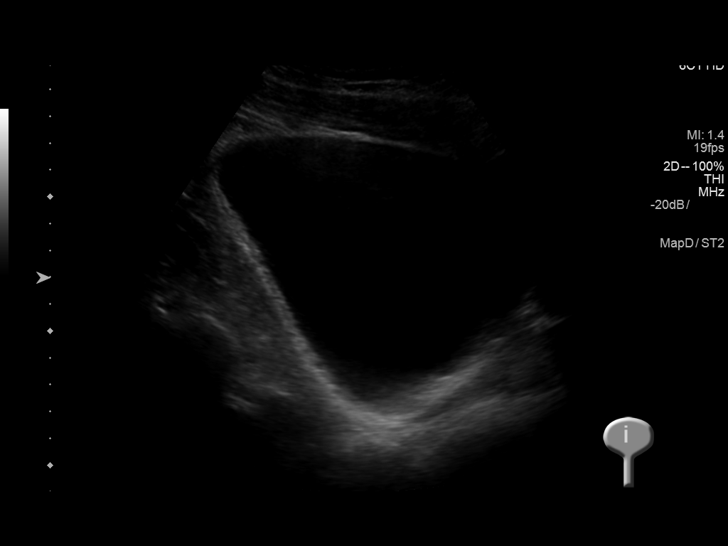
[im 56/56]
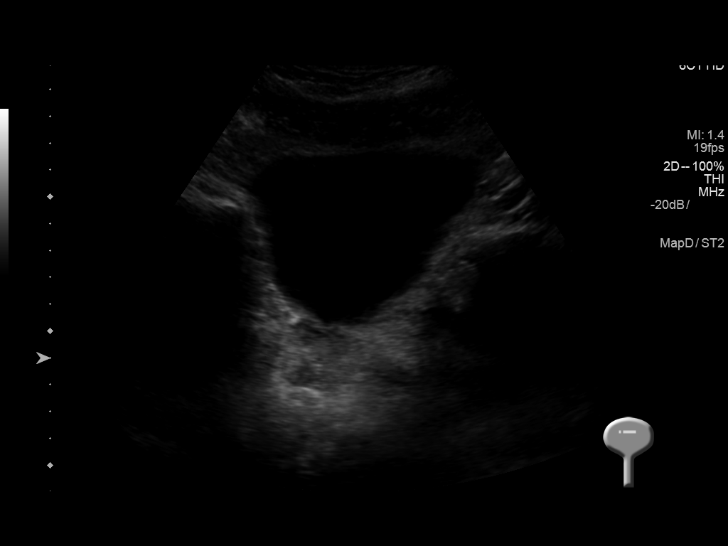

[14 of 25 positions shown; findings below may reference images not displayed]

FINDINGS: Right Kidney:

Renal measurements: 12.3 x 6.7 x 6.1 cm = volume: 263.5 mL .
Echogenicity within normal limits. No mass or hydronephrosis
visualized.

Left Kidney:

Renal measurements: 13.8 x 7.3 x 6.6 cm = volume: 345.3 mL.
Echogenicity within normal limits. No mass or hydronephrosis
visualized.

Bladder:

Appears normal for degree of bladder distention. Bilateral ureteral
jets visualized.
IMPRESSION: Normal renal ultrasound.  No hydronephrosis.

## 2020-10-11 ENCOUNTER — Emergency Department: Payer: Self-pay

## 2020-10-11 ENCOUNTER — Encounter: Payer: Self-pay | Admitting: Emergency Medicine

## 2020-10-11 ENCOUNTER — Other Ambulatory Visit: Payer: Self-pay

## 2020-10-11 ENCOUNTER — Emergency Department
Admission: EM | Admit: 2020-10-11 | Discharge: 2020-10-11 | Disposition: A | Discharge status: Home / Self Care | Payer: Self-pay | Attending: Emergency Medicine | Admitting: Emergency Medicine

## 2020-10-11 DIAGNOSIS — W2209XA Striking against other stationary object, initial encounter: Secondary | ICD-10-CM | POA: Insufficient documentation

## 2020-10-11 DIAGNOSIS — S62354A Nondisplaced fracture of shaft of fourth metacarpal bone, right hand, initial encounter for closed fracture: Secondary | ICD-10-CM | POA: Insufficient documentation

## 2020-10-11 DIAGNOSIS — Y92531 Health care provider office as the place of occurrence of the external cause: Secondary | ICD-10-CM | POA: Insufficient documentation

## 2020-10-11 DIAGNOSIS — S6991XA Unspecified injury of right wrist, hand and finger(s), initial encounter: Secondary | ICD-10-CM

## 2020-10-11 NOTE — ED Triage Notes (Signed)
Patient to ER for c/o right hand injury after punching a wall.

## 2020-10-11 NOTE — ED Provider Notes (Signed)
[facility] Emergency Department Provider Note ____________________________________________  Time seen: 1419  I have reviewed the triage vital signs and the nursing notes.  HISTORY  Chief Complaint  Hand Injury  HPI Fernando Wolfe is a 24 y.o. male presents himself to the ED for evaluation of a self-inflicted injury to the right hand.  Patient immediately punched a wall yesterday on a dental site.  He thought he had cleared the stomach, but subsequently punched the wall at this time, causing acute pain and disability to the lateral aspect of the right hand at the fifth knuckle.  He presents today with swelling, pain, and disability to the right hand, specifically at the pinky finger.   History reviewed. No pertinent past medical history.  Patient Active Problem List   Diagnosis Date Noted  . Acute renal failure (ARF) (HCC) 10/02/2018    History reviewed. No pertinent surgical history.  Prior to Admission medications   Medication Sig Start Date End Date Taking? Authorizing Provider  dicyclomine (BENTYL) 20 MG tablet Take 20 mg by mouth every 4 (four) hours while awake.  10/11/20  [provider]    Allergies Contrast media [iodinated diagnostic agents]  History reviewed. No pertinent family history.  Social History Social History   Tobacco Use  . Smoking status: Never Smoker  . Smokeless tobacco: Never Used  Substance Use Topics  . Alcohol use: Yes    Alcohol/week: 5.0 standard drinks    Types: 5 Cans of beer per week  . Drug use: No    Review of Systems  Constitutional: Negative for fever. Cardiovascular: Negative for chest pain. Respiratory: Negative for shortness of breath. Gastrointestinal: Negative for abdominal pain, vomiting and diarrhea. Genitourinary: Negative for dysuria. Musculoskeletal: Negative for back pain.  Right hand pain as above. Skin: Negative for rash. Neurological: Negative for headaches, focal weakness or  numbness. ____________________________________________  PHYSICAL EXAM:  VITAL SIGNS: ED Triage Vitals [10/11/20 1307]  Enc Vitals Group     BP (!) 141/96     Pulse Rate 92     Resp 18     Temp 99.2 F (37.3 C)     Temp Source Oral     SpO2 98 %     Weight 219 lb (99.3 kg)     Height 5\' 11"  (1.803 m)     Head Circumference      Peak Flow      Pain Score 0     Pain Loc      Pain Edu?      Excl. in GC?     Constitutional: Alert and oriented. Well appearing and in no distress. Head: Normocephalic and atraumatic. Eyes: Conjunctivae are normal. Normal extraocular movements Cardiovascular: Normal rate, regular rhythm. Normal distal pulses. Respiratory: Normal respiratory effort. No wheezes/rales/rhonchi. Gastrointestinal: Soft and nontender. No distention. Musculoskeletal: right hand with dorsal soft tissue swelling. Normal composite fist. Decreased finger extension ROM due to pain. Nontender with normal range of motion in all extremities.  Neurologic:  Normal gross sensation. Normal speech and language. No gross focal neurologic deficits are appreciated. Skin:  Skin is warm, dry and intact. No rash noted. ____________________________________________   RADIOLOGY  DG Right Hand  IMPRESSION: Comminuted fracture distal fourth metacarpal with volar angulation distally. No other evident fracture. No dislocation. No appreciable arthropathy. ____________________________________________  PROCEDURES  Ulnar gutter OCL splint  Procedures ____________________________________________  INITIAL IMPRESSION / ASSESSMENT AND PLAN / ED COURSE  Patient with ED evaluation and initial fracture care of a closed  right fourth metatarsal neck fracture.  There is some mild volar angulation distally.  Patient sustained an injury after a self-inflicted head contusion.  He is neurovascularly intact, so was placed in a ulnar gutter splint.  Follow-up with Ortho for ongoing fracture care.  Return  precautions have been discussed.  Fernando Wolfe was evaluated in Emergency Department on 10/11/2020 for the symptoms described in the history of present illness. He was evaluated in the context of the global COVID-19 pandemic, which necessitated consideration that the patient might be at risk for infection with the SARS-CoV-2 virus that causes COVID-19. Institutional protocols and algorithms that pertain to the evaluation of patients at risk for COVID-19 are in a state of rapid change based on information released by regulatory bodies including the CDC and federal and state organizations. These policies and algorithms were followed during the patient's care in the ED. ____________________________________________  FINAL CLINICAL IMPRESSION(S) / ED DIAGNOSES  Final diagnoses:  Closed nondisplaced fracture of shaft of fourth metacarpal bone of right hand, initial encounter  Injury of right hand, initial encounter      Lissa Hoard, PA-C 10/11/20 1818    Merwyn Katos, MD 10/12/20 [phone]

## 2020-10-11 NOTE — Discharge Instructions (Addendum)
You have sustained a fracture of the bone of your fourth finger in the palm, known as a boxer's fracture.  Wear the splint for protection you may follow-up with orthopedics for ongoing fracture management.  You may take over-the-counter Tylenol or Motrin as needed for pain.

## 2021-06-16 IMAGING — CR DG HAND COMPLETE 3+V*R*
1 series · 4 of 4 positions shown · non-contrast
Comparison: None.

CLINICAL DATA: Pain after punching wall

EXAM:
RIGHT HAND - COMPLETE 3+ VIEW

[Series 1: dg hand complete right · 0.14mm/px · 4 of 4 slices shown]
[im 1/4]
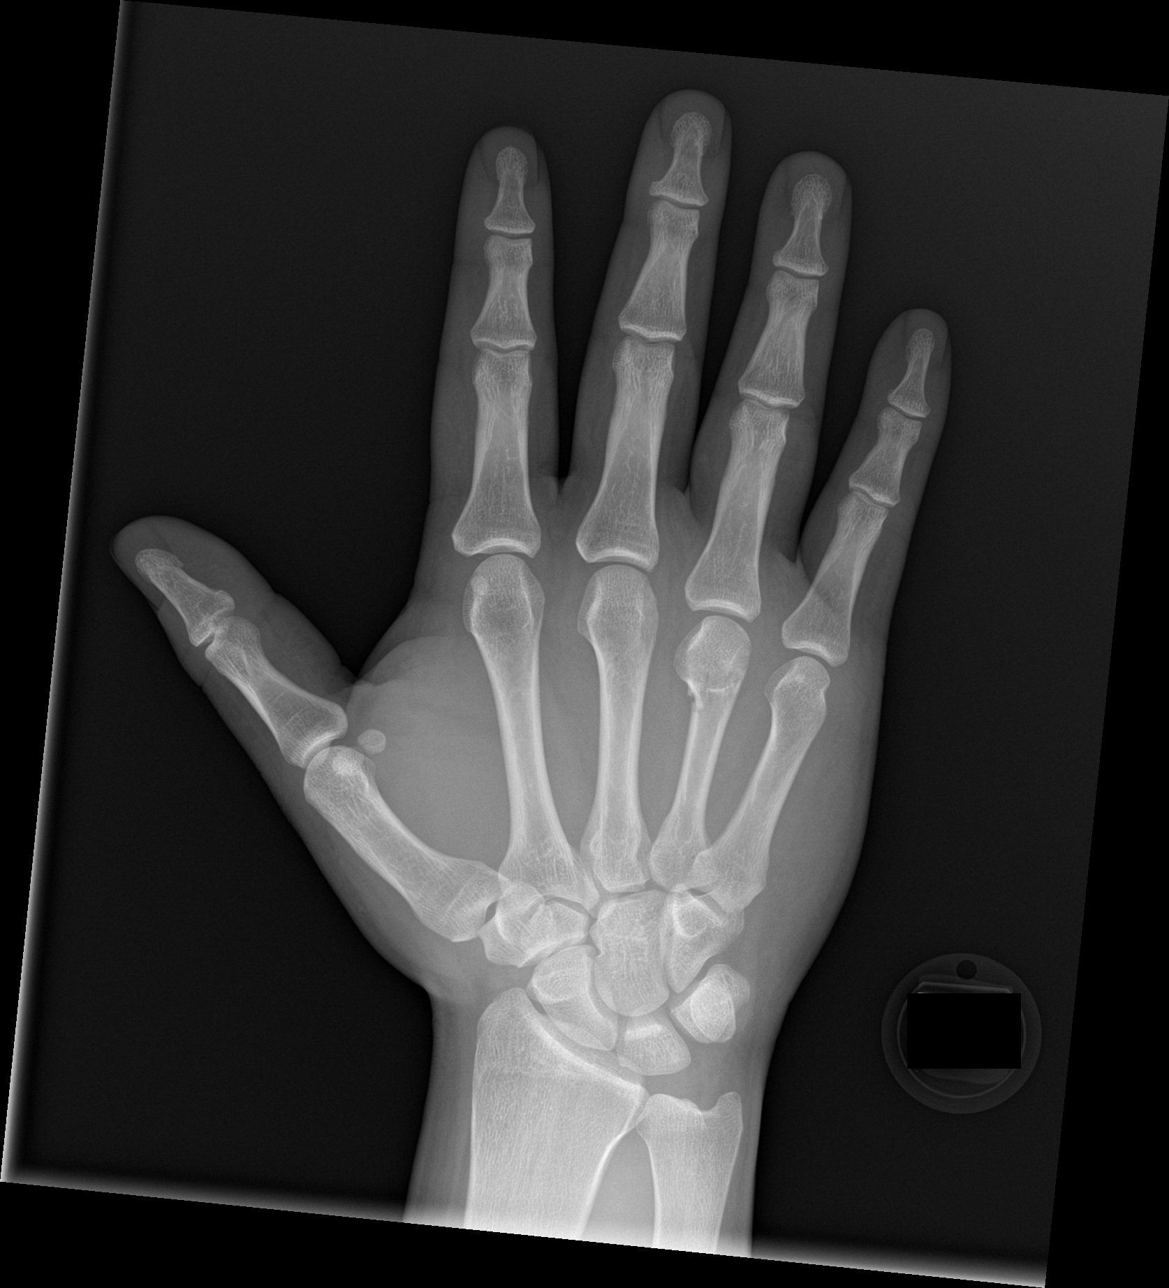
[im 2/4]
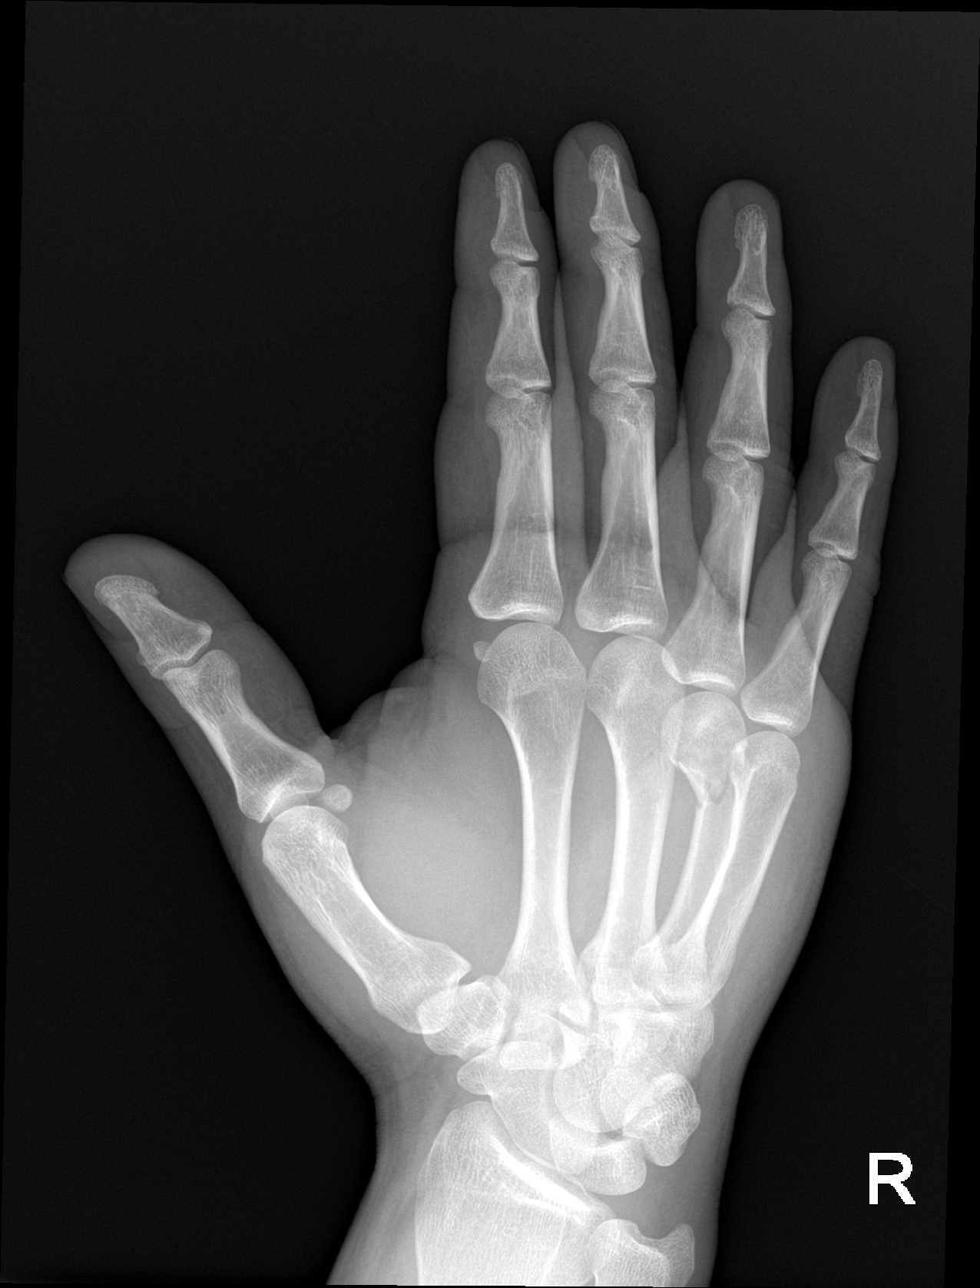
[im 3/4]
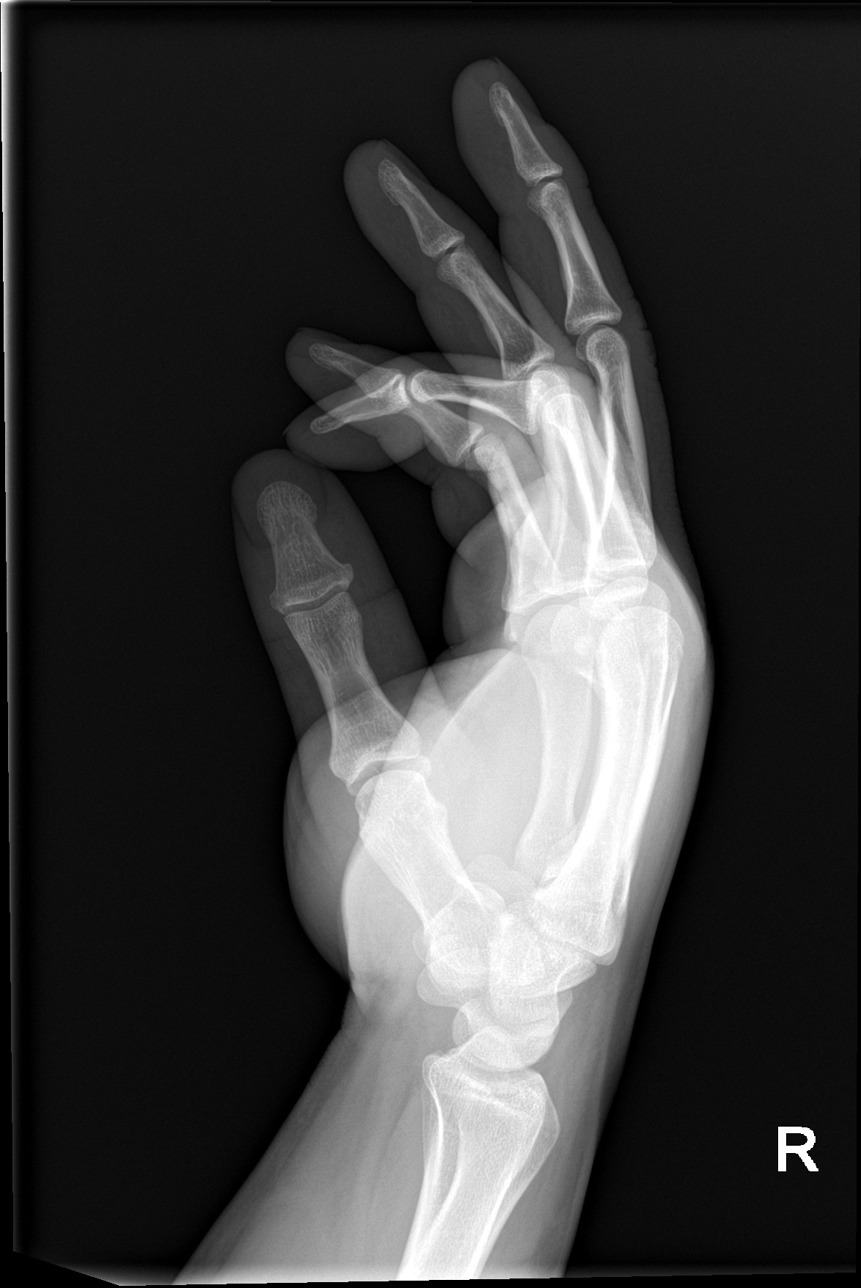
[im 4/4]
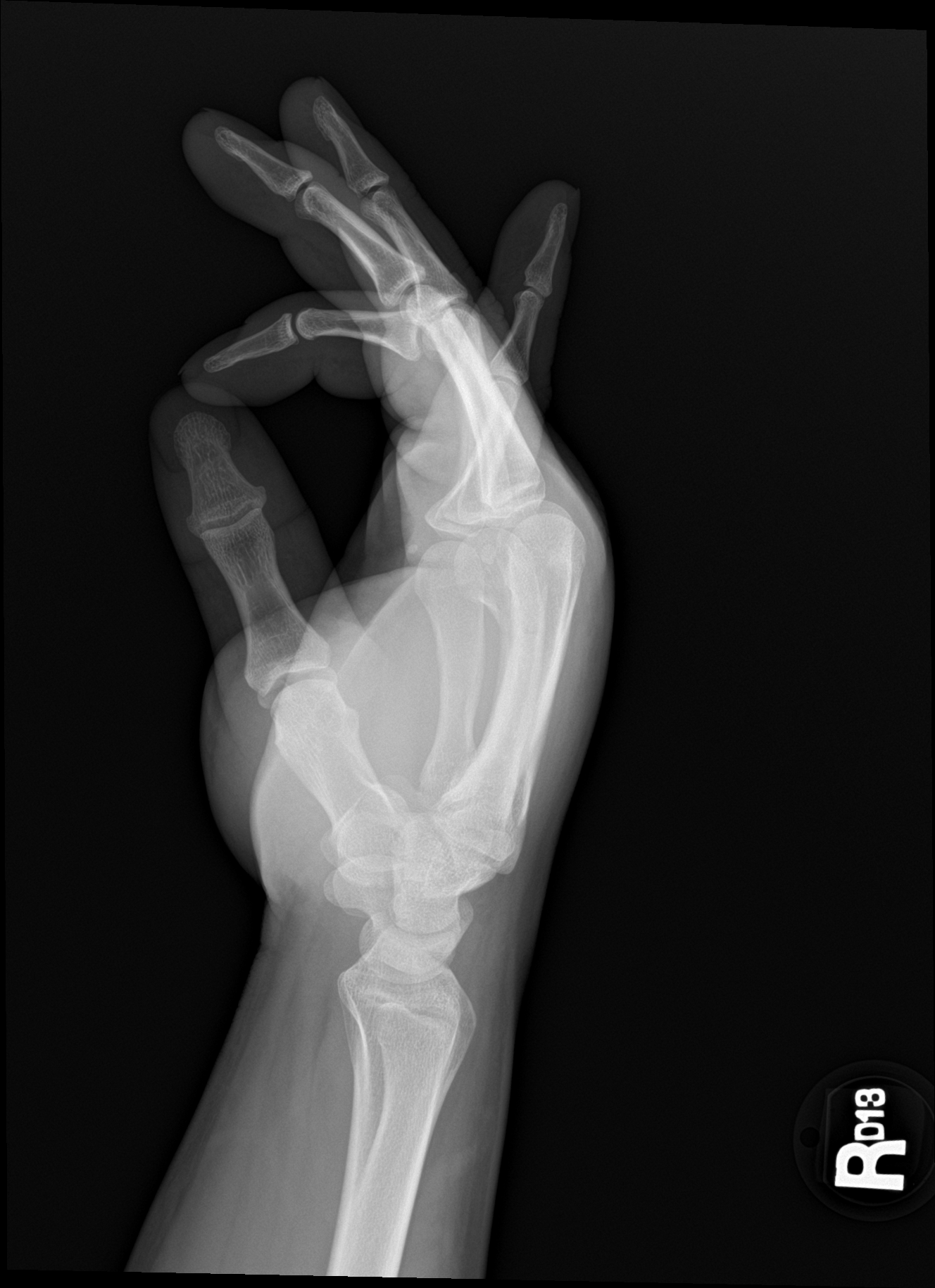

[4 of 4 positions shown; findings below may reference images not displayed]

FINDINGS: Frontal, oblique, and lateral views were obtained. There is a
comminuted fracture of the distal aspect of the fourth metacarpal
with volar angulation distally. No other fracture. No dislocation.
Joint spaces appear normal. No erosive change.
IMPRESSION: Comminuted fracture distal fourth metacarpal with volar angulation
distally. No other evident fracture. No dislocation. No appreciable
arthropathy.

## 2023-01-31 ENCOUNTER — Emergency Department
Admission: EM | Admit: 2023-01-31 | Discharge: 2023-01-31 | Disposition: A | Discharge status: Home / Self Care | Payer: Self-pay | Attending: Emergency Medicine | Admitting: Emergency Medicine

## 2023-01-31 ENCOUNTER — Encounter: Payer: Self-pay | Admitting: Emergency Medicine

## 2023-01-31 DIAGNOSIS — Z20822 Contact with and (suspected) exposure to covid-19: Secondary | ICD-10-CM | POA: Insufficient documentation

## 2023-01-31 DIAGNOSIS — R1110 Vomiting, unspecified: Secondary | ICD-10-CM

## 2023-01-31 DIAGNOSIS — B349 Viral infection, unspecified: Secondary | ICD-10-CM | POA: Insufficient documentation

## 2023-01-31 DIAGNOSIS — D72829 Elevated white blood cell count, unspecified: Secondary | ICD-10-CM | POA: Insufficient documentation

## 2023-01-31 LAB — CBC
HCT: 50.9 % (ref 39.0–52.0)
Hemoglobin: 18 g/dL — ABNORMAL HIGH (ref 13.0–17.0)
MCH: 30.3 pg (ref 26.0–34.0)
MCHC: 35.4 g/dL (ref 30.0–36.0)
MCV: 85.7 fL (ref 80.0–100.0)
Platelets: 217 10*3/uL (ref 150–400)
RBC: 5.94 MIL/uL — ABNORMAL HIGH (ref 4.22–5.81)
RDW: 11.9 % (ref 11.5–15.5)
WBC: 12.2 10*3/uL — ABNORMAL HIGH (ref 4.0–10.5)
nRBC: 0 % (ref 0.0–0.2)

## 2023-01-31 LAB — RESP PANEL BY RT-PCR (RSV, FLU A&B, COVID)  RVPGX2
Influenza A by PCR: NEGATIVE
Influenza B by PCR: NEGATIVE
Resp Syncytial Virus by PCR: NEGATIVE
SARS Coronavirus 2 by RT PCR: NEGATIVE

## 2023-01-31 LAB — COMPREHENSIVE METABOLIC PANEL
ALT: 46 U/L — ABNORMAL HIGH (ref 0–44)
AST: 30 U/L (ref 15–41)
Albumin: 4.7 g/dL (ref 3.5–5.0)
Alkaline Phosphatase: 102 U/L (ref 38–126)
Anion gap: 11 (ref 5–15)
BUN: 15 mg/dL (ref 6–20)
CO2: 26 mmol/L (ref 22–32)
Calcium: 9.6 mg/dL (ref 8.9–10.3)
Chloride: 100 mmol/L (ref 98–111)
Creatinine, Ser: 1.08 mg/dL (ref 0.61–1.24)
GFR, Estimated: >60 mL/min (ref >60)
Glucose, Bld: 103 mg/dL — ABNORMAL HIGH (ref 70–99)
Potassium: 3.6 mmol/L (ref 3.5–5.1)
Sodium: 137 mmol/L (ref 135–145)
Total Bilirubin: 1 mg/dL (ref 0.3–1.2)
Total Protein: 8.5 g/dL — ABNORMAL HIGH (ref 6.5–8.1)

## 2023-01-31 LAB — URINALYSIS, ROUTINE W REFLEX MICROSCOPIC
Bacteria, UA: NONE SEEN
Bilirubin Urine: NEGATIVE
Glucose, UA: NEGATIVE mg/dL
Hgb urine dipstick: NEGATIVE
Ketones, ur: NEGATIVE mg/dL
Nitrite: NEGATIVE
Protein, ur: 30 mg/dL — AB
Specific Gravity, Urine: 1.026 (ref 1.005–1.030)
Squamous Epithelial / HPF: NONE SEEN /HPF (ref 0–5)
pH: 5 (ref 5.0–8.0)

## 2023-01-31 LAB — LIPASE, BLOOD: Lipase: 36 U/L (ref 11–51)

## 2023-01-31 MED ORDER — ONDANSETRON 4 MG PO TBDP: 4.0000 mg | ORAL_TABLET | Freq: Three times a day (TID) | ORAL | 0 refills | Status: DC | PRN

## 2023-01-31 MED ORDER — KETOROLAC TROMETHAMINE 15 MG/ML IJ SOLN
15.0000 mg | Freq: Once | INTRAMUSCULAR | Status: AC
  Administered 2023-01-31: 15 mg via INTRAVENOUS
  Filled 2023-01-31: qty 1

## 2023-01-31 MED ORDER — ONDANSETRON 4 MG PO TBDP: 4.0000 mg | ORAL_TABLET | Freq: Three times a day (TID) | ORAL | 0 refills | Status: AC | PRN

## 2023-01-31 MED ORDER — ONDANSETRON 4 MG PO TBDP
4.0000 mg | ORAL_TABLET | Freq: Once | ORAL | Status: AC | PRN
  Administered 2023-01-31: 4 mg via ORAL
  Filled 2023-01-31: qty 1

## 2023-01-31 MED ORDER — LACTATED RINGERS IV BOLUS
1000.0000 mL | Freq: Once | INTRAVENOUS | Status: AC
  Administered 2023-01-31: 1000 mL via INTRAVENOUS

## 2023-01-31 NOTE — Discharge Instructions (Addendum)
You likely have a viral illness causing the vomiting diarrhea and fever.  Take Tylenol for fever.  You can use the Zofran for nausea and vomiting.  Stick to clear liquids until you are feeling improved and you can try bland solids. Please return to the emergency department for worsening abdominal pain particular pain in the right lower part of your abdomen if you are not able to keep fluids down

## 2023-01-31 NOTE — ED Triage Notes (Signed)
Pt presents ambulatory to triage via POV with complaints of periumbilical pain with associated N/V/D that started yesterday. Hx of GERD and takes Carafate. Rates pain 5/10.  A&Ox4 at this time. Denies CP or SOB.

## 2023-01-31 NOTE — ED Provider Notes (Signed)
[facility] Provider Note    Event Date/Time   First MD Initiated Contact with Patient 01/31/23 2004     (approximate)   History   Abdominal Pain   HPI  Fernando Wolfe is a 27 y.o. male with no seeming past medical history who presents with chills vomiting diarrhea and abdominal pain.  Symptoms started acutely around noon today.  He has had 2 episodes of emesis several episodes of nonbloody diarrhea endorses generalized periumbilical cramping as well as diffuse bodyaches back pain.  Denies true fever but does feel like he has a fever.  No cough runny nose or dyspnea.  No sick contacts.  Denies pain with urination.     History reviewed. No pertinent past medical history.  Patient Active Problem List   Diagnosis Date Noted   Acute renal failure (ARF) ([place]) 10/02/2018     Physical Exam  Triage Vital Signs: ED Triage Vitals  Enc Vitals Group     BP 01/31/23 1916 (!) 166/76     Pulse Rate 01/31/23 1916 (!) 133     Resp 01/31/23 1916 18     Temp 01/31/23 1916 99.8 F (37.7 C)     Temp Source 01/31/23 1916 Oral     SpO2 01/31/23 1916 100 %     Weight 01/31/23 1914 225 lb (102.1 kg)     Height 01/31/23 1914 5\' 11"  (1.803 m)     Head Circumference --      Peak Flow --      Pain Score 01/31/23 1916 5     Pain Loc --      Pain Edu? --      Excl. in [place]? --     Most recent vital signs: Vitals:   01/31/23 2003 01/31/23 2106  BP:  139/82  Pulse:  (!) 109  Resp:  20  Temp: 99.3 F (37.4 C)   SpO2:  99%     General: Awake, no distress.  Patient feels warm, tachycardic CV:  Good peripheral perfusion.  Resp:  Normal effort.  Abd:  No distention.  Abdomen is soft minimal periumbilical tenderness no guarding Neuro:             Awake, Alert, Oriented x 3  Other:     ED Results / Procedures / Treatments  Labs (all labs ordered are listed, but only abnormal results are displayed) Labs Reviewed  COMPREHENSIVE METABOLIC PANEL - Abnormal;  Notable for the following components:      Result Value   Glucose, Bld 103 (*)    Total Protein 8.5 (*)    ALT 46 (*)    All other components within normal limits  CBC - Abnormal; Notable for the following components:   WBC 12.2 (*)    RBC 5.94 (*)    Hemoglobin 18.0 (*)    All other components within normal limits  URINALYSIS, ROUTINE W REFLEX MICROSCOPIC - Abnormal; Notable for the following components:   Color, Urine YELLOW (*)    APPearance CLEAR (*)    Protein, ur 30 (*)    Leukocytes,Ua TRACE (*)    All other components within normal limits  RESP PANEL BY RT-PCR (RSV, FLU A&B, COVID)  RVPGX2  LIPASE, BLOOD     EKG     RADIOLOGY    PROCEDURES:  Critical Care performed: No  Procedures   MEDICATIONS ORDERED IN ED: Medications  ondansetron (ZOFRAN-ODT) disintegrating tablet 4 mg (4 mg Oral Given 01/31/23 1919)  lactated ringers  bolus 1,000 mL (1,000 mLs Intravenous New Bag/Given 01/31/23 2101)  ketorolac (TORADOL) 15 MG/ML injection 15 mg (15 mg Intravenous Given 01/31/23 2102)     IMPRESSION / MDM / ASSESSMENT AND PLAN / ED COURSE  I reviewed the triage vital signs and the nursing notes.                              Patient's presentation is most consistent with acute complicated illness / injury requiring diagnostic workup.  Differential diagnosis includes, but is not limited to, viral gastroenteritis, COVID, influenza, less likely appendicitis, UTI, pyelonephritis Patient is a 27 year old male presents acute onset of vomiting diarrhea and chills.  Started this afternoon has had 2 episodes of emesis several episodes of nonbloody diarrhea some generalized abdominal discomfort diffuse bodyaches.  On arrival he is tachycardic to 130s temp 99.3 feels warm to me I suspect he likely has a true fever if he had taken a rectal temp.  Abdominal exam is overall benign.  He points to the periumbilical region and describes cramping pain there is no right lower quadrant  tenderness have low suspicion for appendicitis or other acute abdominal process.  He does complain of diffuse myalgias.  I suspect this is likely viral.  Labs obtained from triage notable for a mild leukocytosis to 12.2 CMP lipase are reassuring COVID influenza test is negative you a is clean.  He did already receive ODT Zofran and is not vomiting currently.  Given his ongoing tachycardia will give LR bolus and Toradol.  Patient feeling improved after Toradol and fluids.  Heart rate downtrending.  He is tolerating p.o.  Will discharge with ODT Zofran.  Discussed return precautions for worsening abdominal pain particularly right lower quadrant abdominal pain.        FINAL CLINICAL IMPRESSION(S) / ED DIAGNOSES   Final diagnoses:  Viral illness  Vomiting and diarrhea     Rx / DC Orders   ED Discharge Orders          Ordered    ondansetron (ZOFRAN-ODT) 4 MG disintegrating tablet  Every 8 hours PRN        01/31/23 2137             Note:  This document was prepared using Dragon voice recognition software and may include unintentional dictation errors.   Rada Hay, MD 01/31/23 2139
